# Patient Record
Sex: Female | Born: 1980 | Race: Black or African American | Hispanic: No | Marital: Married | Smoking: Current every day smoker
Health system: Southern US, Community
[De-identification: ages and names within clinical notes are randomized; demographics above are authoritative.]

## PROBLEM LIST (undated history)

## (undated) DIAGNOSIS — K219 Gastro-esophageal reflux disease without esophagitis: Secondary | ICD-10-CM

## (undated) DIAGNOSIS — R569 Unspecified convulsions: Secondary | ICD-10-CM

## (undated) DIAGNOSIS — T7840XA Allergy, unspecified, initial encounter: Secondary | ICD-10-CM

## (undated) DIAGNOSIS — Q8501 Neurofibromatosis, type 1: Secondary | ICD-10-CM

## (undated) DIAGNOSIS — S92919A Unspecified fracture of unspecified toe(s), initial encounter for closed fracture: Secondary | ICD-10-CM

## (undated) DIAGNOSIS — D649 Anemia, unspecified: Secondary | ICD-10-CM

## (undated) HISTORY — DX: Allergy, unspecified, initial encounter: T78.40XA

## (undated) HISTORY — PX: FRACTURE SURGERY: SHX138

## (undated) HISTORY — DX: Unspecified convulsions: R56.9

## (undated) HISTORY — PX: TUBAL LIGATION: SHX77

## (undated) HISTORY — PX: BACK SURGERY: SHX140

## (undated) HISTORY — PX: BREAST SURGERY: SHX581

## (undated) HISTORY — DX: Gastro-esophageal reflux disease without esophagitis: K21.9

## (undated) HISTORY — PX: EXPLORATORY LAPAROTOMY: SUR591

## (undated) HISTORY — PX: OTHER SURGICAL HISTORY: SHX169

## (undated) HISTORY — PX: HAND SURGERY: SHX662

## (undated) HISTORY — DX: Anemia, unspecified: D64.9

## (undated) HISTORY — PX: CHOLECYSTECTOMY: SHX55

## (undated) HISTORY — DX: Neurofibromatosis, type 1: Q85.01

## (undated) HISTORY — DX: Unspecified fracture of unspecified toe(s), initial encounter for closed fracture: S92.919A

---
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
## 2004-11-11 ENCOUNTER — Observation Stay: Payer: Self-pay | Admitting: Gynecology

## 2004-11-30 ENCOUNTER — Observation Stay: Payer: Self-pay | Admitting: Obstetrics & Gynecology

## 2005-01-15 ENCOUNTER — Observation Stay: Payer: Self-pay

## 2005-02-01 ENCOUNTER — Observation Stay: Payer: Self-pay

## 2005-05-29 ENCOUNTER — Emergency Department: Payer: Self-pay | Admitting: Emergency Medicine

## 2005-07-28 ENCOUNTER — Emergency Department: Payer: Self-pay | Admitting: Emergency Medicine

## 2005-08-19 ENCOUNTER — Emergency Department: Payer: Self-pay | Admitting: Emergency Medicine

## 2005-12-31 ENCOUNTER — Emergency Department: Payer: Self-pay | Admitting: Emergency Medicine

## 2005-12-31 ENCOUNTER — Other Ambulatory Visit: Payer: Self-pay

## 2006-03-08 ENCOUNTER — Other Ambulatory Visit: Payer: Self-pay

## 2006-03-08 ENCOUNTER — Emergency Department: Payer: Self-pay | Admitting: Emergency Medicine

## 2008-07-24 ENCOUNTER — Emergency Department: Payer: Self-pay | Admitting: Emergency Medicine

## 2008-07-24 ENCOUNTER — Other Ambulatory Visit: Payer: Self-pay

## 2014-10-18 ENCOUNTER — Emergency Department: Payer: Self-pay | Admitting: Internal Medicine

## 2014-10-18 LAB — COMPREHENSIVE METABOLIC PANEL
Albumin: 3.8 g/dL (ref 3.4–5.0)
Alkaline Phosphatase: 63 U/L
Anion Gap: 7 (ref 7–16)
BUN: 17 mg/dL (ref 7–18)
Bilirubin,Total: 0.2 mg/dL (ref 0.2–1.0)
Calcium, Total: 8.7 mg/dL (ref 8.5–10.1)
Chloride: 109 mmol/L — ABNORMAL HIGH (ref 98–107)
Co2: 26 mmol/L (ref 21–32)
Creatinine: 0.77 mg/dL (ref 0.60–1.30)
EGFR (African American): >60
EGFR (Non-African Amer.): >60
Glucose: 93 mg/dL (ref 65–99)
Osmolality: 284 (ref 275–301)
Potassium: 3.7 mmol/L (ref 3.5–5.1)
SGOT(AST): 17 U/L (ref 15–37)
SGPT (ALT): 22 U/L
Sodium: 142 mmol/L (ref 136–145)
Total Protein: 7 g/dL (ref 6.4–8.2)

## 2014-10-18 LAB — CBC
HCT: 38.4 % (ref 35.0–47.0)
HGB: 11.6 g/dL — ABNORMAL LOW (ref 12.0–16.0)
MCH: 21.1 pg — ABNORMAL LOW (ref 26.0–34.0)
MCHC: 30.3 g/dL — ABNORMAL LOW (ref 32.0–36.0)
MCV: 70 fL — ABNORMAL LOW (ref 80–100)
Platelet: 183 10*3/uL (ref 150–440)
RBC: 5.52 10*6/uL — ABNORMAL HIGH (ref 3.80–5.20)
RDW: 14.2 % (ref 11.5–14.5)
WBC: 5.6 10*3/uL (ref 3.6–11.0)

## 2014-10-18 LAB — URINALYSIS, COMPLETE
Bacteria: NONE SEEN
RBC,UR: 3864 /HPF (ref 0–5)
Specific Gravity: 1.023 (ref 1.003–1.030)
Squamous Epithelial: NONE SEEN
WBC UR: 75 /HPF (ref 0–5)

## 2014-10-18 LAB — DRUG SCREEN, URINE

## 2014-10-18 LAB — ETHANOL: Ethanol: <3 mg/dL

## 2015-01-30 ENCOUNTER — Inpatient Hospital Stay
Admit: 2015-01-30 | Disposition: A | Discharge status: Home / Self Care | Payer: Self-pay | Attending: Internal Medicine | Admitting: Internal Medicine

## 2015-01-30 LAB — ETHANOL

## 2015-01-30 LAB — CBC
HCT: 37.3 % (ref 35.0–47.0)
HGB: 11.6 g/dL — ABNORMAL LOW (ref 12.0–16.0)
MCH: 21.1 pg — ABNORMAL LOW (ref 26.0–34.0)
MCHC: 31.1 g/dL — ABNORMAL LOW (ref 32.0–36.0)
MCV: 68 fL — AB (ref 80–100)
PLATELETS: 186 10*3/uL (ref 150–440)
RBC: 5.49 10*6/uL — AB (ref 3.80–5.20)
RDW: 14.4 % (ref 11.5–14.5)
WBC: 6.3 10*3/uL (ref 3.6–11.0)

## 2015-01-30 LAB — COMPREHENSIVE METABOLIC PANEL
ALT: 14 U/L
Albumin: 4 g/dL
Alkaline Phosphatase: 58 U/L
Anion Gap: 3 — ABNORMAL LOW (ref 7–16)
BUN: 7 mg/dL
Bilirubin,Total: 0.3 mg/dL
CO2: 27 mmol/L
Calcium, Total: 8.7 mg/dL — ABNORMAL LOW
Chloride: 108 mmol/L
Creatinine: 0.67 mg/dL
EGFR (Non-African Amer.): >60
Glucose: 99 mg/dL
Potassium: 3.8 mmol/L
SGOT(AST): 15 U/L
SODIUM: 138 mmol/L
Total Protein: 6.9 g/dL

## 2015-01-30 LAB — DRUG SCREEN, URINE
AMPHETAMINES, UR SCREEN: NEGATIVE
Barbiturates, Ur Screen: NEGATIVE
Benzodiazepine, Ur Scrn: POSITIVE
Cannabinoid 50 Ng, Ur ~~LOC~~: NEGATIVE
Cocaine Metabolite,Ur ~~LOC~~: NEGATIVE
MDMA (ECSTASY) UR SCREEN: NEGATIVE
Methadone, Ur Screen: NEGATIVE
OPIATE, UR SCREEN: NEGATIVE
PHENCYCLIDINE (PCP) UR S: NEGATIVE
TRICYCLIC, UR SCREEN: POSITIVE

## 2015-01-30 LAB — URINALYSIS, COMPLETE
BACTERIA: NONE SEEN
BLOOD: NEGATIVE
Bilirubin,UR: NEGATIVE
GLUCOSE, UR: NEGATIVE mg/dL (ref 0–75)
Ketone: NEGATIVE
Leukocyte Esterase: NEGATIVE
NITRITE: NEGATIVE
Ph: 6 (ref 4.5–8.0)
Protein: NEGATIVE
RBC,UR: NONE SEEN /HPF (ref 0–5)
SPECIFIC GRAVITY: 1.009 (ref 1.003–1.030)

## 2015-01-30 LAB — VALPROIC ACID LEVEL: Valproic Acid: <10 ug/mL (ref 50–100)

## 2015-01-31 LAB — IRON AND TIBC
Iron Bind.Cap.(Total): 340 (ref 250–450)
Iron Saturation: 20
Iron: 68 ug/dL
Unbound Iron-Bind.Cap.: 272.3

## 2015-01-31 LAB — BASIC METABOLIC PANEL
Anion Gap: 5 — ABNORMAL LOW (ref 7–16)
BUN: 7 mg/dL
Calcium, Total: 8.4 mg/dL — ABNORMAL LOW
Chloride: 108 mmol/L
Co2: 26 mmol/L
Creatinine: 0.5 mg/dL
EGFR (African American): >60
EGFR (Non-African Amer.): >60
Glucose: 74 mg/dL
Potassium: 3.9 mmol/L
Sodium: 139 mmol/L

## 2015-01-31 LAB — CBC WITH DIFFERENTIAL/PLATELET
Basophil #: 0 10*3/uL (ref 0.0–0.1)
Basophil %: 0.9 %
Eosinophil #: 0.3 10*3/uL (ref 0.0–0.7)
Eosinophil %: 7.4 %
HCT: 34.4 % — ABNORMAL LOW (ref 35.0–47.0)
HGB: 10.7 g/dL — ABNORMAL LOW (ref 12.0–16.0)
Lymphocyte #: 2.3 10*3/uL (ref 1.0–3.6)
Lymphocyte %: 47.6 %
MCH: 21.2 pg — ABNORMAL LOW (ref 26.0–34.0)
MCHC: 31.1 g/dL — ABNORMAL LOW (ref 32.0–36.0)
MCV: 68 fL — ABNORMAL LOW (ref 80–100)
Monocyte #: 0.3 x10 3/mm (ref 0.2–0.9)
Monocyte %: 6 %
Neutrophil #: 1.8 10*3/uL (ref 1.4–6.5)
Neutrophil %: 38.1 %
Platelet: 152 10*3/uL (ref 150–440)
RBC: 5.05 10*6/uL (ref 3.80–5.20)
RDW: 14.4 % (ref 11.5–14.5)
WBC: 4.7 10*3/uL (ref 3.6–11.0)

## 2015-01-31 LAB — MAGNESIUM: Magnesium: 1.8 mg/dL

## 2015-01-31 LAB — FERRITIN: Ferritin (ARMC): 7 ng/mL — ABNORMAL LOW

## 2015-01-31 LAB — VALPROIC ACID LEVEL: Valproic Acid: 43 ug/mL — ABNORMAL LOW (ref 50–100)

## 2015-02-01 LAB — URINALYSIS, COMPLETE
BLOOD: NEGATIVE
Bilirubin,UR: NEGATIVE
Glucose,UR: NEGATIVE mg/dL (ref 0–75)
KETONE: NEGATIVE
Nitrite: NEGATIVE
PROTEIN: NEGATIVE
Ph: 7 (ref 4.5–8.0)
SPECIFIC GRAVITY: 1.005 (ref 1.003–1.030)

## 2015-02-14 NOTE — Discharge Summary (Signed)
PATIENT NAME:  Valerie Mckinney, Valerie Mckinney MR#:  [id number] DATE OF BIRTH:  06/07/1981  DATE OF ADMISSION:  01/30/2015 DATE OF DISCHARGE:  02/01/2015  ADMITTING DIAGNOSIS:  Status epilepticus.   DISCHARGE DIAGNOSES:  1. Epilepsy with recurrent seizures with postictal confusion, resolved.  2. Cardiac murmur of unclear etiology at this time.  3. Insomnia, on trazodone.  4. Iron deficiency anemia, unknown etiology.  5. Generalized weakness.  6. Lower back pain.  7. Dysuria.  No urinary tract infection.  8. History of epilepsy, neurofibromatosis, right ankle surgery, exploratory laparoscopy in the remote past.   DISCHARGE CONDITION:  Stable.   DISCHARGE MEDICATIONS:  The patient is to continue Keppra at higher doses at 1 gram twice daily, zolpidem 5 mg at bedtime as needed, iron sulfate 325 mg 3 times daily with meals, docusate sodium 100 mg twice daily, melatonin 6 mg p.o. at bedtime.   HOME OXYGEN:  None.   DIET:  Regular consistency.   ACTIVITY LIMITATIONS:  As tolerated.   REFERRAL:  To home health physical therapy.    FOLLOWUP APPOINTMENTS:  With primary physician in two days after discharge, [facility] neurology in one month after discharge, and Dr. Dossie Arbour in two days after discharge.    CONSULTANTS:  Care management; social work; Dr. Valora Corporal, neurologist; also physical therapy.   RADIOLOGIC STUDIES:  None.   HOSPITAL COURSE:  The patient is a 34 year old African American female with past medical history significant for history of epilepsy who presents to the hospital with complaints of seizure. Please refer to Dr. Lianne Moris admission note on 01/30/2015.  Apparently, the patient had a seizure at home, and then on arrival to the Emergency Room, she had two additional episodes of seizure.  Post seizures, she was confused and was admitted to the hospital for further evaluation.  Initial vitals:  Temperature was 98.4, pulse was 80, respirations were 18-20, blood pressure was 115/79, and saturation  was 100% on room air.  Physical exam was unremarkable except for lethargy as well as confusion.  The patient's lab data done on arrival to the hospital showed normal BMP, alcohol level less than 5.  Liver enzymes were normal.  The patient's valproic acid level was less than 10.  Urine drug screen was positive for benzodiazepines as well as tricyclic antidepressants. On the patient's CBC, white blood cell count was 6.3, hemoglobin was 11.6, and platelet count was 186,000, with low MCV of 68.  Urinalysis was unremarkable.  The patient was admitted to the hospital for further evaluation, Keppra was advanced, and neurology consultation was obtained.  Dr. Valora Corporal saw the patient in consultation on 01/31/2015.  He felt that patient had epilepsy with breakthrough seizures, epilepsy which was reasonably controlled and present since childhood.  He recommended to continue high doses of Keppra at 1 gram twice daily and follow up with her primary care physician.  In regard to insomnia which was felt to be moderate and could provoke further seizures significant enough to need to be treated, he recommended to start the patient on melatonin at 6 mg at bedtime as well as Ambien as needed.  She was recommended not to drive or operate heavy machinery for the next six months and follow up with [facility] in three months after discharge.  She was also advised to sleep at least 7 or 8 hours a night.  The patient did well with current medications.  Her postictal confusion resolved as time progressed.  She was noted to have significant weakness  in the lower extremities, but she told the rounding physician that she always has this kind of symptoms, including hoarseness in her throat, postictally.  Since she was having some lower back pains and she has been having low back pains for a while, she was felt to need evaluation by Dr. Dossie Arbour and possibly some radiologic studies as outpatient.  Meanwhile, she was recommended to be  discharged home with home health physical therapy by physical therapist seeing her in the hospital.  On the day of discharge, she felt satisfactory, did not complain of any significant discomfort.  She is being discharged with the above medications and followup.  Her vital signs:  Temperature was 98.7, pulse was 91, respirations were 16, blood pressure was 113/74, and saturation was 99% on room air at rest.  Of note, the patient was noted to be anemic in the hospital with very low MCV. The patient's iron studies revealed significant iron deficiency anemia with ferritin level of 7.  She was initiated on iron supplements and guaiac of stool was ordered, however, not obtained during her stay in the hospital time.  It is recommended to get guaiac of stool as outpatient and further investigate her iron deficiency anemia.  Meanwhile, she was prescribed iron supplementation along with docusate sodium.  The patient is being discharged in stable condition with the above-mentioned medications and followup.   TIME SPENT:  Forty minutes    ____________________________ Theodoro Grist, MD rv:kc D: 02/01/2015 15:01:03 ET T: 02/01/2015 18:55:43 ET JOB#: [id number]  cc: Theodoro Grist, MD, <Dictator> Primary Care Physician [facility] [place]. Dossie Arbour, MD  Theodoro Grist MD ELECTRONICALLY SIGNED 02/02/2015 19:32

## 2015-02-14 NOTE — H&P (Signed)
PATIENT NAME:  Valerie, Mckinney MR#:  [id number] DATE OF BIRTH:  1981/02/10  DATE OF ADMISSION:  01/30/2015  PRIMARY CARE PHYSICIAN:   None local.    REFEREING PHYSICIAN: Dr.  Lavonia Drafts, MD  CHIEF COMPLAINT:   Seizure today.     HISTORY OF PRESENT ILLNESS: A 34 year old [place] American female with a history of seizure disorder, was sent to ED due to seizure episode today. The patient is alert, awake, but confused. According to Dr. Corky Downs, the patient was found to have a seizure at home this morning.   EMS gave her 2 Versed and then the patient was sent to ED for further evaluation.  The patient had 2 additional episodes of seizure.   Dr. Corky Downs gave Depakote IV in ED and also give 1 dose of Ativan. The patient's seizure stopped, but the patient is still confused.  Dr. Corky Downs requests admission for seizure.   PAST MEDICAL HISTORY: Seizure disorder, neurofibromatosis, tumor of right lower back.   PAST SURGICAL HISTORY: Tumor removal from the right lower back, cholecystectomy, right ankle surgery, and right breast surgery.   SOCIAL HISTORY:  Smokes 4 cigarettes a day for 17 years. Denies any alcohol drinking or illicit drugs.   FAMILY HISTORY: Mother has an enlarged heart and heart attack recently.   ALLERGIES: TO BENADRYL, FLAGYL, TYLENOL, ZOFRAN, TAPE.   HOME MEDICATION:  According to patient, the patient is taking Keppra 750 mg p.o. b.i.d.   REVIEW OF SYSTEMS:  CONSTITUTIONAL: The patient denies any fever or chills but has headache, dizziness,  weakness, and generalized body ache.  EYES: No double vision or blurred vision.  EARS, NOSE, AND THROAT: No postnasal drip, slurred speech, or dysphagia.   CARDIOVASCULAR: No chest pain, palpitation, orthopnea, or nocturnal dyspnea. No leg edema.  PULMONARY: No cough, sputum, shortness of breath, or hematemesis.  GASTROINTESTINAL: No abdominal pain, nausea, vomiting, or diarrhea. No melena or bloody stool.  GENITOURINARY:   No dysuria,   hematuria, or incontinence.  ENDOCRINE: No polyuria, polydipsia, heat or cold intolerance.  HEMATOLOGY: No easy bruising or bleeding.  NEUROLOGIC: Positive for seizure, loss of consciousness,   PHYSICAL EXAMINATION: VITAL SIGNS: Temperature 98.4, blood pressure 115/79, pulse 80, oxygen saturation 100% on room air.  GENERAL: The patient is alert, weak, AAO x3, but looks confused and lethargic, in no acute distress.  HEENT: Pupils round, equal, and react to light and accommodation.  NECK: Supple. No JVD or carotid bruit. No lymphadenopathy. No thyromegaly.  CARDIOVASCULAR: S1 and S2, regular rate and rhythm. No murmurs or gallops.  PULMONARY: Bilateral air entry. No wheezing or rales. No use of accessory muscles to breathe.  ABDOMEN: Soft. No distention or tenderness. No organomegaly. Bowel sounds present.  EXTREMITIES: No edema, clubbing or cyanosis. No calf tenderness.  Bilateral pedal pulses preset.  SKIN: No rash or jaundice.  NEUROLOGY: AAO x3 and looks lethargic and confused.   Follows commands. No focal deficit. Power 5 out of 5. Sensation intact.   LABORATORY DATA: Glucose 99, BUN 7, creatinine 0.67. Electrolytes are normal. Urinalysis showed benzodiazepine positive, tricyclic antidepressant positive.   WBCs 6.3, hemoglobin 11.6, platelets 186,000.  Urinalysis is negative.   IMPRESSIONS:  Status epilepticus. Tobacco abuse.  PLAN OF TREATMENT:  The patient will be admitted to medical floor and will start aspiration,  fall and seizure precautions, and neurologic checks.   Continue Keppra IV b.i.d.  and ativan prn. Follow-up neurology consult and also followup valproic acid.   Smoking cessation was counselled for  3 mintues.   Discussed the patient's condition and plan of treatment with the patient.   TIME SPENT: About 52 minutes.    ____________________________ Demetrios Loll, MD qc:tr D: 01/30/2015 14:53:59 ET T: 01/30/2015 16:37:16 ET JOB#: [id number]  cc: Demetrios Loll, MD,  <Dictator> Demetrios Loll MD ELECTRONICALLY SIGNED 01/30/2015 19:12

## 2015-02-14 NOTE — Consult Note (Signed)
Past Medical/Surgical Hx:  neurofibromitosis:   tumor to right lower back:   Ankle Surgery - Right:   exploratory lap:   Home Medications: Medication Instructions Last Modified Date/Time  levETIRAcetam 500 mg oral tablet 3 tabs (1577m) orally 2 times a day. 17-Apr-16 08:41   Allergies:  Tape: Rash  Benadryl: Seizures  Tylenol: Seizures  Flagyl: Hives  Zofran ODT: Hives  Vital Signs: **Vital Signs.:   17-Apr-16 16:52  Vital Signs Type Q 8hr  Temperature Temperature (F) 98.9  Celsius 37.1  Temperature Source oral  Pulse Pulse 72  Respirations Respirations 18  Systolic BP Systolic BP 1481 Diastolic BP (mmHg) Diastolic BP (mmHg) 82  Mean BP 97  Pulse Ox % Pulse Ox % 98  Pulse Ox Activity Level  At rest  Oxygen Delivery Room Air/ 21 %   Lab Results: Hepatic:  16-Apr-16 11:10   Bilirubin, Total 0.3 (0.3-1.2 NOTE: New Reference Range  12/22/14)  Alkaline Phosphatase 58 (38-126 NOTE: New Reference Range  12/22/14)  SGPT (ALT) 14 (14-54 NOTE: New Reference Range  12/22/14)  SGOT (AST) 15 (15-41 NOTE: New Reference Range  12/22/14)  Total Protein, Serum 6.9 (6.5-8.1 NOTE: New Reference Range  12/22/14)  Albumin, Serum 4.0 (3.5-5.0 NOTE: New reference range  12/22/14)  TDMs:  17-Apr-16 04:11   Valproic Acid, Serum  43 (Result(s) reported on 31 Jan 2015 at [facility])  Routine Chem:  16-Apr-16 11:10   Ethanol, S. <5 (0-0 NOTE: New Reference Range:  12/22/14)  17-Apr-16 04:11   Iron Binding Capacity (TIBC) 340  Unbound Iron Binding Capacity 272.3  Iron, Serum 68 (28-170 NOTE: New Reference Range:  12/22/14)  Iron Saturation 20.0 (Result(s) reported on 31 Jan 2015 at 09:33AM.)  Ferritin ([facility]  7 (11-307 NOTE: New Reference Range  12/22/14)  Glucose, Serum 74 (65-99 NOTE: New Reference Range  12/22/14)  BUN 7 (6-20 NOTE: New Reference Range  12/22/14)  Creatinine (comp) 0.50 (0.44-1.00 NOTE: New Reference Range  12/22/14)  Sodium, Serum 139  (135-145 NOTE: New Reference Range  12/22/14)  Potassium, Serum 3.9 (3.5-5.1 NOTE: New Reference Range  12/22/14)  Chloride, Serum 108 (101-111 NOTE: New Reference Range  12/22/14)  CO2, Serum 26 (22-32 NOTE: New Reference Range  12/22/14)  Calcium (Total), Serum  8.4 (8.9-10.3 NOTE: New Reference Range  12/22/14)  Anion Gap  5  eGFR (African American) >60  eGFR (Non-African American) >60 (eGFR values <621mmin/1.73 m2 may be an indication of chronic kidney disease (CKD). Calculated eGFR is useful in patients with stable renal function. The eGFR calculation will not be reliable in acutely ill patients when serum creatinine is changing rapidly. It is not useful in patients on dialysis. The eGFR calculation may not be applicable to patients at the low and high extremes of body sizes, pregnant women, and vegetarians.)  Magnesium, Serum 1.8 (1.7-2.4 THERAPEUTIC RANGE: 4-7 mg/dL TOXIC: > 10 mg/dL  ----------------------- NOTE: New Reference Range  12/22/14)  Urine Drugs:  [id number] Tricyclic Antidepressant, Ur Qual (comp) POSITIVE (Result(s) reported on 30 Jan 2015 at 12:22PM.)  Amphetamines, Urine Qual. NEGATIVE  MDMA, Urine Qual. NEGATIVE  Cocaine Metabolite, Urine Qual. NEGATIVE  Opiate, Urine qual NEGATIVE  Phencyclidine, Urine Qual. NEGATIVE  Cannabinoid, Urine Qual. NEGATIVE  Barbiturates, Urine Qual. NEGATIVE  Benzodiazepine, Urine Qual. POSITIVE (------------------ The URINE DRUG SCREEN provides only a preliminary, unconfirmed analytical test result and should not be used for non-medical purposes.  Clinical consideration and professional judgment should be applied to any positive drug  screen result due to possible interfering substances.  A more specific alternate chemical method must be used in order to obtain a confirmed analytical result.  Gas chromatography/mass spectrometry (GC/MS) is the preferred confirmatory method.)  Methadone, Urine Qual.  NEGATIVE  Routine UA:  16-Apr-16 11:10   Color (UA) Yellow  Clarity (UA) Clear  Glucose (UA) Negative  Bilirubin (UA) Negative  Ketones (UA) Negative  Specific Gravity (UA) 1.009  Blood (UA) Negative  pH (UA) 6.0  Protein (UA) Negative  Nitrite (UA) Negative  Leukocyte Esterase (UA) Negative (Result(s) reported on 30 Jan 2015 at 11:42AM.)  RBC (UA) NONE SEEN  WBC (UA) 0-5  Bacteria (UA) NONE SEEN  Epithelial Cells (UA) 0-5  Mucous (UA) PRESENT (Result(s) reported on 30 Jan 2015 at 11:42AM.)  Routine Hem:  17-Apr-16 04:11   WBC (CBC) 4.7  RBC (CBC) 5.05  Hemoglobin (CBC)  10.7  Hematocrit (CBC)  34.4  Platelet Count (CBC) 152  MCV  68  MCH  21.2  MCHC  31.1  RDW 14.4  Neutrophil % 38.1  Lymphocyte % 47.6  Monocyte % 6.0  Eosinophil % 7.4  Basophil % 0.9  Neutrophil # 1.8  Lymphocyte # 2.3  Monocyte # 0.3  Eosinophil # 0.3  Basophil # 0.0 (Result(s) reported on 31 Jan 2015 at 05:18AM.)   Impression/Recommendations: Recommendations:   Impression:Epilepsy-  mostly controlled and presents since childhooldInsomnia-  moderate, could provoke further seizures increase Keppra to 1gm BID start melatonin 29m qHS add low dose Ambien [id number] pt needs to sleep 7-8 hours nightly no driving or operating heavy machinery x 6 months can go [facility] in morning [facility]  [facility]  Electronic Signatures: SmJamison NeighborMD)  (Signed 17-Apr-16 20:28)  Authored: PAST MEDICAL/SURGICAL HISTORY, HOME MEDICATIONS, ALLERGIES, NURSING VITAL SIGNS, LAB RESULTS, Recommendations   Last Updated: 17-Apr-16 20:28 by SmJamison NeighborMD)

## 2015-09-16 ENCOUNTER — Encounter: Payer: Self-pay | Admitting: Pain Medicine

## 2015-09-16 ENCOUNTER — Ambulatory Visit: Payer: Medicare Other | Attending: Pain Medicine | Admitting: Pain Medicine

## 2015-09-16 VITALS — BP 116/84 | HR 90 | Temp 98.2°F | Resp 14 | Ht 65.0 in | Wt 170.0 lb

## 2015-09-16 DIAGNOSIS — G43909 Migraine, unspecified, not intractable, without status migrainosus: Secondary | ICD-10-CM | POA: Insufficient documentation

## 2015-09-16 DIAGNOSIS — G4700 Insomnia, unspecified: Secondary | ICD-10-CM | POA: Insufficient documentation

## 2015-09-16 DIAGNOSIS — G40909 Epilepsy, unspecified, not intractable, without status epilepticus: Secondary | ICD-10-CM | POA: Insufficient documentation

## 2015-09-16 DIAGNOSIS — K219 Gastro-esophageal reflux disease without esophagitis: Secondary | ICD-10-CM | POA: Diagnosis not present

## 2015-09-16 DIAGNOSIS — G43109 Migraine with aura, not intractable, without status migrainosus: Secondary | ICD-10-CM

## 2015-09-16 DIAGNOSIS — Q8500 Neurofibromatosis, unspecified: Secondary | ICD-10-CM

## 2015-09-16 DIAGNOSIS — M160 Bilateral primary osteoarthritis of hip: Secondary | ICD-10-CM

## 2015-09-16 DIAGNOSIS — M5481 Occipital neuralgia: Secondary | ICD-10-CM | POA: Insufficient documentation

## 2015-09-16 DIAGNOSIS — M533 Sacrococcygeal disorders, not elsewhere classified: Secondary | ICD-10-CM

## 2015-09-16 NOTE — Progress Notes (Signed)
Subjective:    Patient ID: Valerie Mckinney, female    DOB: [birth year], 34 y.o.   MRN: [id number]  HPI The patient is a 34 year old female who comes to pain management center at the request of Dr. Sabino Snipes for further evaluation and treatment of pain. The patient is with history of neurofibromatosis. 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 Patient states that she has had abnormalities occurring in the region of the hip as well the patient states that her pain is becoming more increasingly severe. The patient described her pain as aching agonizing annoying constant cramping "deep disabling distressing. We'll build exhausting sensations patient stated the pain was getting along with heavy like horrible sensation sometimes hot sensation nagging and pressure-like sensations occurring. Patient described her pain as sharp shooting splitting stepping tenderness throbbing tingling sensation that it appeared the patient ability to go to sleep and awakens patient from sleep. Patient stated the pain was associated with spasms and increased with bending climbing intercourse kneeling lifting motion sitting standing squatting stooping twisting walking. Patient stated the pain was made worse with surgery. Patient stated the pain has decreased with warm showers and baths as well as hot packs and medications. We discussed patient's overall condition and patient is with history of seizure disorder with seizure occurring this year. We informed patient that we would prefer to avoid prescribing medications for patient due to her seizure disorder. We will have patient follow up with her neurologist Dr. Melrose Nakayama for further assessment of her condition at this time. We also informed patient that we will have patient undergo evaluation at [place] [facility] to to and would have [facility] to recommend treatment regimen for patient. We informed patient that  we may treat patient once we receive recommendations from [facility]. The patient was understanding and agreed to suggested treatment plan and will proceed with evaluation at [facility] once her appointment becomes available. At the present time patient will follow-up with Dr. Melrose Nakayama and we will remain available to proceed with further treatment of pain of patient pending recommendations of [place] [facility]. At the present time patient is taking Keppra and other medications. Patient stated that she had received significant benefit from Neurontin in the past. We have informed patient to discussed the use of Neurontin with Dr. Melrose Nakayama at time of return appointment with Dr. Melrose Nakayama. The patient was in agreement suggested treatment plan we will await [place] [facility] recommendations as as well as results of evaluation and treatment of Dr. Melrose Nakayama and consider patient for further treatment as discussed. The patient agreed to suggested treatment plan.     Review of Systems     Cardiovascular: Unremarkable   Pulmonary: Unremarkable  Neurological: Seizures  Psychological: Insomnia  Gastrointestinal: Unremarkable  Genitourinary: Gastroesophageal reflux disease  Hematologic:   anemia Endocrine: Unremarkable  Rheumatological: Unremarkable  Musculoskeletal: Unremarkable  Other significant:  neurofibromatosis     Objective:   Physical Exam  The patient appeared to be well-developed well nourished female in no acute distress  There was tenderness to palpation of paraspinal musculatures and cervical region cervical facet region. Palpation reproduced radiation of pain in the cervical region toward the occipitalis region. There was tends to palpation of the splenius capitis and occipitalis regions a moderate degree. No bounding pulsations of the temporal regions were noted. There was unremarkable Spurling's maneuver. Patient appeared to be with  bilaterally equal grip  strength. Tinel and Phalen's maneuver were without increased pain of significant degree. Palpation over the thoracic region thoracic facet region was without tends to palpation of severe degree. There was moderate tenderness to palpation over the thoracic paraspinal musculature region. No crepitus of the thoracic region was noted. Palpation over the lumbar paraspinal musculature region lumbar facet region was attends to palpation of moderate degree with lateral bending rotation extension and palpation of the lumbar facets reproducing moderate discomfort. There was moderate tenderness to palpation over the PSIS NP IIS regions. Straight leg raising was tolerated to 30 without a definite increased pain with dorsiflexion noted. DTRs were trace at the knees. No definite sensory deficit or dermatomal distribution detected. There was negative clonus negative Homans. There was minimal tenderness of the greater trochanteric region iliotibial band region. Abdomen was nontender with no costovertebral tenderness noted.      Assessment & Plan:   Neurofibromatosis  Seizure disorder  Migraine headaches  Greater occipital neuralgia     PLAN   Continue present medications as discussed and reviewed medications with Dr. Melrose Nakayama at time of return appointment. Patient is to inform Dr. Melrose Nakayama of her previous use of Neurontin and benefit with use of Neurontin in terms of control of pain at the present time patient will continue Keppra as prescribed for control of her seizures   F/U PCP Dr. Sabino Snipes for  evaliation of  BP and general medical  condition  F/U surgical evaluation. May consider pending follow-up evaluations  F/U neurological evaluation. . Patient will follow-up with Dr. Melrose Nakayama as planned. Patient advised to mention her previous use of Neurontin with Dr. Melrose Nakayama. Patient stated Neurontin had been of significant benefit in terms of reducing her pain due to  neurofibromatosis. Patient presently taking Keppra well-controlled of her seizures. Patient also with history of migraine headaches and is without use of Topamax in the past Appointment [place] for further evaluation and recommendations regarding treatment of patient's condition. Patient with history of neurofibromatosis and seizure disorder  May consider radiofrequency rhizolysis or intraspinal procedures pending response to present treatment and F/U evaluation   Patient to call Pain Management Center should patient have concerns prior to scheduled return appointment.

## 2015-09-16 NOTE — Patient Instructions (Addendum)
Continue present medications  F/U PCP Dr.Meridth Soles  for evaliation of  BP and general medical  condition  F/U surgical evaluation. May consider pending follow-up evaluations. Patient will undergo further surgical evaluation as planned  Ask receptionist date of your appointment at [facility]  F/U neurological evaluation. Follow-up Dr. Melrose Nakayama as discussed . Please mention Neurontin to Dr. Melrose Nakayama at time of your reevaluation  May consider radiofrequency rhizolysis or intraspinal procedures pending response to present treatment and F/U evaluation   Patient to call Pain Management Center should patient have concerns prior to scheduled return appointment.

## 2015-09-17 ENCOUNTER — Other Ambulatory Visit: Payer: Self-pay | Admitting: Pain Medicine

## 2015-09-21 ENCOUNTER — Other Ambulatory Visit: Payer: Self-pay | Admitting: Pain Medicine

## 2015-09-21 DIAGNOSIS — Q8500 Neurofibromatosis, unspecified: Secondary | ICD-10-CM

## 2015-10-01 ENCOUNTER — Other Ambulatory Visit: Payer: Self-pay | Admitting: Pain Medicine

## 2015-10-22 ENCOUNTER — Ambulatory Visit: Admission: RE | Admit: 2015-10-22 | Payer: Medicare Other | Source: Ambulatory Visit

## 2015-10-22 ENCOUNTER — Ambulatory Visit
Admission: RE | Admit: 2015-10-22 | Discharge: 2015-10-22 | Disposition: A | Discharge status: Home / Self Care | Payer: Medicare Other | Source: Ambulatory Visit | Attending: Pain Medicine | Admitting: Pain Medicine

## 2015-10-22 DIAGNOSIS — Q8500 Neurofibromatosis, unspecified: Secondary | ICD-10-CM | POA: Insufficient documentation

## 2015-10-22 DIAGNOSIS — M50222 Other cervical disc displacement at C5-C6 level: Secondary | ICD-10-CM | POA: Insufficient documentation

## 2015-10-22 DIAGNOSIS — M50221 Other cervical disc displacement at C4-C5 level: Secondary | ICD-10-CM | POA: Diagnosis not present

## 2015-10-25 ENCOUNTER — Telehealth: Payer: Self-pay | Admitting: *Deleted

## 2015-10-25 NOTE — Telephone Encounter (Signed)
Spoke with patient re; MRI result, transferred up to front desk to schedule appt for f/up.

## 2015-10-29 ENCOUNTER — Other Ambulatory Visit: Payer: Self-pay

## 2015-11-02 ENCOUNTER — Emergency Department
Admission: EM | Admit: 2015-11-02 | Discharge: 2015-11-02 | Disposition: A | Discharge status: Home / Self Care | Payer: Medicare Other | Attending: Emergency Medicine | Admitting: Emergency Medicine

## 2015-11-02 ENCOUNTER — Emergency Department: Payer: Medicare Other

## 2015-11-02 DIAGNOSIS — N938 Other specified abnormal uterine and vaginal bleeding: Secondary | ICD-10-CM

## 2015-11-02 DIAGNOSIS — R102 Pelvic and perineal pain: Secondary | ICD-10-CM

## 2015-11-02 DIAGNOSIS — Z3202 Encounter for pregnancy test, result negative: Secondary | ICD-10-CM | POA: Diagnosis not present

## 2015-11-02 DIAGNOSIS — Z79899 Other long term (current) drug therapy: Secondary | ICD-10-CM | POA: Diagnosis not present

## 2015-11-02 DIAGNOSIS — R1030 Lower abdominal pain, unspecified: Secondary | ICD-10-CM | POA: Diagnosis present

## 2015-11-02 DIAGNOSIS — F17210 Nicotine dependence, cigarettes, uncomplicated: Secondary | ICD-10-CM | POA: Insufficient documentation

## 2015-11-02 LAB — URINALYSIS COMPLETE WITH MICROSCOPIC (ARMC ONLY)
BACTERIA UA: NONE SEEN
SQUAMOUS EPITHELIAL / LPF: NONE SEEN
Specific Gravity, Urine: 1.025 (ref 1.005–1.030)

## 2015-11-02 LAB — COMPREHENSIVE METABOLIC PANEL
ALT: 13 U/L — AB (ref 14–54)
AST: 14 U/L — AB (ref 15–41)
Albumin: 4.3 g/dL (ref 3.5–5.0)
Alkaline Phosphatase: 52 U/L (ref 38–126)
Anion gap: 6 (ref 5–15)
BUN: 7 mg/dL (ref 6–20)
CHLORIDE: 105 mmol/L (ref 101–111)
CO2: 25 mmol/L (ref 22–32)
Calcium: 8.7 mg/dL — ABNORMAL LOW (ref 8.9–10.3)
Creatinine, Ser: 0.66 mg/dL (ref 0.44–1.00)
GFR calc Af Amer: >60 mL/min (ref >60)
GFR calc non Af Amer: >60 mL/min (ref >60)
GLUCOSE: 106 mg/dL — AB (ref 65–99)
Potassium: 3.9 mmol/L (ref 3.5–5.1)
SODIUM: 136 mmol/L (ref 135–145)
TOTAL PROTEIN: 7.4 g/dL (ref 6.5–8.1)
Total Bilirubin: 0.6 mg/dL (ref 0.3–1.2)

## 2015-11-02 LAB — CBC
HEMATOCRIT: 36.7 % (ref 35.0–47.0)
Hemoglobin: 11.5 g/dL — ABNORMAL LOW (ref 12.0–16.0)
MCH: 20.9 pg — ABNORMAL LOW (ref 26.0–34.0)
MCHC: 31.2 g/dL — AB (ref 32.0–36.0)
MCV: 66.9 fL — AB (ref 80.0–100.0)
Platelets: 214 10*3/uL (ref 150–440)
RBC: 5.48 MIL/uL — ABNORMAL HIGH (ref 3.80–5.20)
RDW: 15.7 % — AB (ref 11.5–14.5)
WBC: 6 10*3/uL (ref 3.6–11.0)

## 2015-11-02 LAB — CHLAMYDIA/NGC RT PCR (ARMC ONLY)
Chlamydia Tr: NOT DETECTED
N gonorrhoeae: NOT DETECTED

## 2015-11-02 LAB — LIPASE, BLOOD: LIPASE: 22 U/L (ref 11–51)

## 2015-11-02 LAB — WET PREP, GENITAL
CLUE CELLS WET PREP: NONE SEEN
Sperm: NONE SEEN
Trich, Wet Prep: NONE SEEN
YEAST WET PREP: NONE SEEN

## 2015-11-02 LAB — POCT PREGNANCY, URINE: PREG TEST UR: NEGATIVE

## 2015-11-02 MED ORDER — SODIUM CHLORIDE 0.9 % IV BOLUS (SEPSIS)
1000.0000 mL | Freq: Once | INTRAVENOUS | Status: AC
  Administered 2015-11-02: 1000 mL via INTRAVENOUS

## 2015-11-02 MED ORDER — OXYCODONE HCL 5 MG PO TABS: 5.0000 mg | ORAL_TABLET | Freq: Three times a day (TID) | ORAL | Status: DC | PRN

## 2015-11-02 MED ORDER — FENTANYL CITRATE (PF) 100 MCG/2ML IJ SOLN
50.0000 ug | Freq: Once | INTRAMUSCULAR | Status: AC
  Administered 2015-11-02: 50 ug via INTRAVENOUS
  Filled 2015-11-02: qty 2

## 2015-11-02 MED ORDER — KETOROLAC TROMETHAMINE 30 MG/ML IJ SOLN
30.0000 mg | Freq: Once | INTRAMUSCULAR | Status: AC
  Administered 2015-11-02: 30 mg via INTRAVENOUS
  Filled 2015-11-02: qty 1

## 2015-11-02 NOTE — ED Notes (Signed)
Pt reports pain to her RUQ for the past week. Pt reports pain worse today. Pt denies NVD, other sx's.

## 2015-11-02 NOTE — ED Notes (Signed)
Patient transported to Ultrasound 

## 2015-11-02 NOTE — ED Notes (Signed)
Pt also reports to RN her cycle started early yesterday and she is passing clots.
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
## 2015-11-16 ENCOUNTER — Ambulatory Visit: Payer: Medicare Other | Attending: Pain Medicine | Admitting: Pain Medicine

## 2015-11-16 ENCOUNTER — Encounter: Payer: Self-pay | Admitting: Pain Medicine

## 2015-11-16 VITALS — BP 123/70 | HR 83 | Temp 98.4°F | Resp 16 | Ht 65.0 in | Wt 170.0 lb

## 2015-11-16 DIAGNOSIS — R51 Headache: Secondary | ICD-10-CM | POA: Diagnosis present

## 2015-11-16 DIAGNOSIS — M546 Pain in thoracic spine: Secondary | ICD-10-CM | POA: Diagnosis present

## 2015-11-16 DIAGNOSIS — Q8500 Neurofibromatosis, unspecified: Secondary | ICD-10-CM | POA: Diagnosis not present

## 2015-11-16 DIAGNOSIS — M5481 Occipital neuralgia: Secondary | ICD-10-CM | POA: Insufficient documentation

## 2015-11-16 DIAGNOSIS — M533 Sacrococcygeal disorders, not elsewhere classified: Secondary | ICD-10-CM

## 2015-11-16 DIAGNOSIS — M170 Bilateral primary osteoarthritis of knee: Secondary | ICD-10-CM

## 2015-11-16 DIAGNOSIS — G43109 Migraine with aura, not intractable, without status migrainosus: Secondary | ICD-10-CM

## 2015-11-16 DIAGNOSIS — M160 Bilateral primary osteoarthritis of hip: Secondary | ICD-10-CM

## 2015-11-16 DIAGNOSIS — M50221 Other cervical disc displacement at C4-C5 level: Secondary | ICD-10-CM | POA: Diagnosis not present

## 2015-11-16 DIAGNOSIS — G43909 Migraine, unspecified, not intractable, without status migrainosus: Secondary | ICD-10-CM | POA: Diagnosis not present

## 2015-11-16 DIAGNOSIS — G40909 Epilepsy, unspecified, not intractable, without status epilepticus: Secondary | ICD-10-CM | POA: Diagnosis not present

## 2015-11-16 DIAGNOSIS — M47816 Spondylosis without myelopathy or radiculopathy, lumbar region: Secondary | ICD-10-CM | POA: Diagnosis not present

## 2015-11-16 DIAGNOSIS — M5030 Other cervical disc degeneration, unspecified cervical region: Secondary | ICD-10-CM | POA: Insufficient documentation

## 2015-11-16 DIAGNOSIS — M179 Osteoarthritis of knee, unspecified: Secondary | ICD-10-CM | POA: Insufficient documentation

## 2015-11-16 DIAGNOSIS — M1710 Unilateral primary osteoarthritis, unspecified knee: Secondary | ICD-10-CM | POA: Insufficient documentation

## 2015-11-16 DIAGNOSIS — M542 Cervicalgia: Secondary | ICD-10-CM | POA: Diagnosis present

## 2015-11-16 DIAGNOSIS — M50222 Other cervical disc displacement at C5-C6 level: Secondary | ICD-10-CM | POA: Insufficient documentation

## 2015-11-16 NOTE — Progress Notes (Signed)
   Subjective:    Patient ID: Valerie Mckinney, female    DOB: [birth year], 35 y.o.   MRN: [id number]  HPI    Review of Systems     Objective:   Physical Exam        Assessment & Plan:

## 2015-11-16 NOTE — Progress Notes (Signed)
Safety precautions to be maintained throughout the outpatient stay will include: orient to surroundings, keep bed in low position, maintain call bell within reach at all times, provide assistance with transfer out of bed and ambulation.  

## 2015-11-16 NOTE — Patient Instructions (Addendum)
Continue present medications  Sphenopalatine ganglion block to be performed at time return appointment  F/U PCP Dr.Meridth Soles  for evaliation of  BP and general medical  condition  F/U surgical evaluation. Ask receptionist the date of your neurosurgical evaluation  Ask receptionist date of your appointment at [facility]  Ask receptionist the date of MRI of your right knee  F/U neurological evaluation. Follow-up Dr. Melrose Nakayama as discussed . Please mention Neurontin to Dr. Melrose Nakayama at time of your reevaluation  May consider radiofrequency rhizolysis or intraspinal procedures pending response to present treatment and F/U evaluation . We  will avoid these procedures at this time  Patient to call [facility] Management Center should patient have concerns prior to scheduled return appointment.

## 2015-11-16 NOTE — Progress Notes (Signed)
Subjective:    Patient ID: Valerie Mckinney, female    DOB: Dec 13, 1980, 35 y.o.   MRN: [id number]  HPI  The patient is a 35 year old female who returns [facility] for further evaluation and treatment of pain involving the neck headaches entire back upper and lower right pain and lower extremity region. We discussed findings on patient's MRI of the spinal today's visit. Decision has been made to refer patient to neurosurgeon for further evaluation of patient's condition regarding intraspinal abnormalities. We discussed patient's headaches as well and we'll consider patient for sphenopalatine ganglion block to be performed at time return appointment in attempt to decrease severity of and frequency of patient's migraine headaches. There is also concern regarding component of headaches been due to greater occipital neuralgia and will consider patient for further evaluation and treatment in this regard as well. At the present time we will schedule patient for sphenopalatine ganglion block to be performed at time return appointment to see with scheduling patient for neurosurgical evaluation as discussed. We will also discuss having patient undergo evaluation at [facility] as well and pending recommendations of [place] we will consider patient for further modifications of treatment regimen. The patient will continue presently prescribed medications and will be scheduled for sphenopalatine Engman block to be performed at time return appointment in attempt to decrease severity of symptoms, minimize progression of symptoms, and avoid need for more involved treatment. The patient agreed to suggested treatment plan         Review of Systems     Objective:   Physical Exam There was tenderness of the splenius capitis and occipitalis musculature region of moderate to moderately severe degree. No bounding pulsations of the temporal region were noted. Palpation of  the cervical facet cervical paraspinal must reason was attends to palpation of moderate degree. The patient was with evidence of tenderness to palpation over the thoracic facet thoracic paraspinal must reason a moderate degree with no crepitus of the thoracic region noted. There appeared to be unremarkable Spurling's maneuver and patient appeared to be without increase of pain with Tinel and Phalen's maneuver and was with what appeared to be bilaterally equal grip strength. Palpation over the region of the thoracic facet thoracic paraspinal must reason with evidence of muscle spasms without crepitus of the thoracic region is noted. Palpation over the lumbar paraspinal must reason lumbar facet region associated with mild to moderate discomfort with lateral bending rotation extension and palpation over the lumbar facets reproducing mild to moderate discomfort. Straight leg raising was tolerates approximately 20 without increased pain with dorsiflexion noted. DTRs were difficult to elicit. There was no definite sensory deficit of dermatomal distribution detected. EHL strength appeared to be slightly decreased. There was negative clonus negative Homans. Palpation over the region of the ASIS and PII S region reproduced mild to moderate discomfort. With mild tenderness on the greater trochanteric region and iliotibial band region. 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 No lumbar disc protrusions, spinal or foraminal stenosis  Neurofibromatosis  Seizure disorder  Migraine headaches  Greater occipital neuralgia    PLAN  Continue  present medications  Sphenopalatine ganglion block to be performed at time return appointment  F/U PCP Dr.Meridth Soles  for evaliation of  BP and general medical  condition  F/U surgical evaluation. Ask receptionist the date of your neurosurgical evaluation  Ask receptionist date of your appointment at [facility]  Ask receptionist the date of MRI of your right knee  F/U neurological evaluation. Follow-up Dr. Melrose Nakayama as discussed . Please mention Neurontin to Dr. Melrose Nakayama at time of your reevaluation  May consider radiofrequency rhizolysis or intraspinal procedures pending response to present treatment and F/U evaluation . We  will avoid these procedures at this time  Patient to call Pain Management Center should patient have concerns prior to scheduled return appointment.

## 2015-11-22 ENCOUNTER — Encounter: Payer: Self-pay | Admitting: Pain Medicine

## 2015-11-22 ENCOUNTER — Ambulatory Visit: Payer: Medicare Other | Attending: Pain Medicine | Admitting: Pain Medicine

## 2015-11-22 VITALS — BP 125/78 | HR 71 | Temp 98.0°F | Resp 16 | Ht 65.0 in | Wt 175.0 lb

## 2015-11-22 DIAGNOSIS — G43109 Migraine with aura, not intractable, without status migrainosus: Secondary | ICD-10-CM

## 2015-11-22 DIAGNOSIS — M533 Sacrococcygeal disorders, not elsewhere classified: Secondary | ICD-10-CM

## 2015-11-22 DIAGNOSIS — R51 Headache: Secondary | ICD-10-CM | POA: Diagnosis present

## 2015-11-22 DIAGNOSIS — M160 Bilateral primary osteoarthritis of hip: Secondary | ICD-10-CM

## 2015-11-22 DIAGNOSIS — M170 Bilateral primary osteoarthritis of knee: Secondary | ICD-10-CM

## 2015-11-22 DIAGNOSIS — Q8500 Neurofibromatosis, unspecified: Secondary | ICD-10-CM

## 2015-11-22 DIAGNOSIS — M5481 Occipital neuralgia: Secondary | ICD-10-CM

## 2015-11-22 MED ORDER — LIDOCAINE HCL 4 % EX SOLN
CUTANEOUS | Status: AC
  Filled 2015-11-22: qty 50

## 2015-11-22 NOTE — Progress Notes (Signed)
   Subjective:    Patient ID: Valerie Mckinney, female    DOB: 1981-02-08, 35 y.o.   MRN: [id number]  HPI                                         SPHENOPALATINE  GANGLION BLOCK    The patient Is a  35 year old female who returns to pain management for further evaluation and treatment of headache felt to be due to migraine headache predominantly.. Decision has been made to proceed with sphenopalatine ganglion block to reduce severity an frequency of headaches    Description Of Procedure:  Sphenopalatine Ganglion Block   Patient assumed supine position with the head hyperextended..   EKG,blood pressure, pulse, and  Pulse oximetry monitoring were all in place.  1 cc of 4% lidocaine was instilled into the right nostril  followed by 1 cc of 4% lidocaine instilled into the left nostril. A cotton pledget soaked with 1 cc 4% lidocaine was then placed  into the right nostril followed by placement of a cotton pledget soaked with 1 cc of 4 % lidocaine placed into the left nostril. The remained in the position for 30 minutes  The patient tolerated the procedure well without complaint of headache at end of the procedure.    PLAN   Continue present medications  F/U PCP Dr.Meridth Soles  for evaliation of  BP and general medical  condition  F/U surgical evaluation. Ask receptionist the date of your neurosurgical evaluation  Ask receptionist date of your appointment at [facility]  Ask receptionist the date of MRI of your right knee  F/U neurological evaluation. Follow-up Dr. Melrose Nakayama as discussed . Please mention Neurontin to Dr. Melrose Nakayama at time of your reevaluation  May consider radiofrequency rhizolysis or intraspinal procedures pending response to present treatment and F/U evaluation . We  will avoid these procedures at this time  Patient to call Pain Management Center should patient have concerns prior to scheduled return appointment.   Review of Systems     Objective:   Physical Exam        Assessment & Plan:

## 2015-11-22 NOTE — Patient Instructions (Signed)
PLAN   Continue present medications  F/U PCP Dr.Meridth Soles  for evaliation of  BP and general medical  condition  F/U surgical evaluation. Ask receptionist the date of your neurosurgical evaluation  Ask receptionist date of your appointment at [facility]  Ask receptionist the date of MRI of your right knee  F/U neurological evaluation. Follow-up Dr. Melrose Nakayama as discussed . Please mention Neurontin to Dr. Melrose Nakayama at time of your reevaluation  May consider radiofrequency rhizolysis or intraspinal procedures pending response to present treatment and F/U evaluation . We  will avoid these procedures at this time  Patient to call Pain Management Center should patient have concerns prior to scheduled return appointment.

## 2015-11-22 NOTE — Progress Notes (Signed)
Safety precautions to be maintained throughout the outpatient stay will include: orient to surroundings, keep bed in low position, maintain call bell within reach at all times, provide assistance with transfer out of bed and ambulation.  

## 2015-11-23 ENCOUNTER — Telehealth: Payer: Self-pay | Admitting: *Deleted

## 2015-11-23 NOTE — Telephone Encounter (Signed)
Pt denies any problems

## 2015-12-08 ENCOUNTER — Ambulatory Visit
Admission: RE | Admit: 2015-12-08 | Discharge: 2015-12-08 | Disposition: A | Discharge status: Home / Self Care | Payer: Medicare Other | Source: Ambulatory Visit | Attending: Pain Medicine | Admitting: Pain Medicine

## 2015-12-08 DIAGNOSIS — M5481 Occipital neuralgia: Secondary | ICD-10-CM

## 2015-12-08 DIAGNOSIS — M545 Low back pain: Secondary | ICD-10-CM | POA: Diagnosis present

## 2015-12-08 DIAGNOSIS — M25561 Pain in right knee: Secondary | ICD-10-CM | POA: Diagnosis present

## 2015-12-08 DIAGNOSIS — G43109 Migraine with aura, not intractable, without status migrainosus: Secondary | ICD-10-CM

## 2015-12-08 DIAGNOSIS — M533 Sacrococcygeal disorders, not elsewhere classified: Secondary | ICD-10-CM

## 2015-12-08 DIAGNOSIS — Q8500 Neurofibromatosis, unspecified: Secondary | ICD-10-CM

## 2015-12-08 DIAGNOSIS — M160 Bilateral primary osteoarthritis of hip: Secondary | ICD-10-CM

## 2015-12-09 ENCOUNTER — Telehealth: Payer: Self-pay | Admitting: *Deleted

## 2015-12-09 NOTE — Telephone Encounter (Signed)
Spoke with patient, let her know there were no significant changes in MRI of knee and that Dr Primus Bravo would discuss this in further detail at her next appt. Patient verbalizes u/o this information.

## 2015-12-13 ENCOUNTER — Ambulatory Visit: Payer: Medicare Other | Attending: Pain Medicine | Admitting: Pain Medicine

## 2015-12-13 ENCOUNTER — Encounter: Payer: Self-pay | Admitting: Pain Medicine

## 2015-12-13 VITALS — BP 128/70 | HR 78 | Temp 98.8°F | Resp 18 | Ht 65.0 in | Wt 170.0 lb

## 2015-12-13 DIAGNOSIS — M47812 Spondylosis without myelopathy or radiculopathy, cervical region: Secondary | ICD-10-CM | POA: Diagnosis not present

## 2015-12-13 DIAGNOSIS — M546 Pain in thoracic spine: Secondary | ICD-10-CM | POA: Diagnosis present

## 2015-12-13 DIAGNOSIS — M170 Bilateral primary osteoarthritis of knee: Secondary | ICD-10-CM

## 2015-12-13 DIAGNOSIS — M50221 Other cervical disc displacement at C4-C5 level: Secondary | ICD-10-CM | POA: Diagnosis not present

## 2015-12-13 DIAGNOSIS — G40909 Epilepsy, unspecified, not intractable, without status epilepticus: Secondary | ICD-10-CM | POA: Insufficient documentation

## 2015-12-13 DIAGNOSIS — G43909 Migraine, unspecified, not intractable, without status migrainosus: Secondary | ICD-10-CM | POA: Insufficient documentation

## 2015-12-13 DIAGNOSIS — M47816 Spondylosis without myelopathy or radiculopathy, lumbar region: Secondary | ICD-10-CM | POA: Insufficient documentation

## 2015-12-13 DIAGNOSIS — Q8500 Neurofibromatosis, unspecified: Secondary | ICD-10-CM | POA: Diagnosis not present

## 2015-12-13 DIAGNOSIS — R51 Headache: Secondary | ICD-10-CM | POA: Insufficient documentation

## 2015-12-13 DIAGNOSIS — M5030 Other cervical disc degeneration, unspecified cervical region: Secondary | ICD-10-CM | POA: Diagnosis not present

## 2015-12-13 DIAGNOSIS — M160 Bilateral primary osteoarthritis of hip: Secondary | ICD-10-CM

## 2015-12-13 DIAGNOSIS — G43109 Migraine with aura, not intractable, without status migrainosus: Secondary | ICD-10-CM

## 2015-12-13 DIAGNOSIS — M5481 Occipital neuralgia: Secondary | ICD-10-CM | POA: Diagnosis not present

## 2015-12-13 DIAGNOSIS — M542 Cervicalgia: Secondary | ICD-10-CM | POA: Diagnosis present

## 2015-12-13 DIAGNOSIS — M533 Sacrococcygeal disorders, not elsewhere classified: Secondary | ICD-10-CM

## 2015-12-13 NOTE — Patient Instructions (Addendum)
PLAN   Continue present medications  Sphenopalatine ganglion block to be performed at time of return appointment  F/U PCP Dr.Meridth Soles  for evaliation of  BP and general medical  condition  F/U surgical evaluation. Ask receptionist the date of your neurosurgical evaluation  Ask receptionist date of your appointment at [facility] schedule appointment at [facility] pain clinic or [facility] pain clinic if more convenient for patient  F/U neurological evaluation. Follow-up Dr. Melrose Nakayama as discussed . Please mention Neurontin to Dr. Melrose Nakayama at time of your reevaluation  May consider radiofrequency rhizolysis or intraspinal procedures pending response to present treatment and F/U evaluation . We  will avoid these procedures at this time  Patient to call Pain Management Center should patient have concerns prior to scheduled return appointment.     Preparation for the injection: 1. Do not eat any solid food or dairy products within 6 hours of your appointment. 2. You may drink clear liquids up to 2 hours before appointment.  Clear liquids include water, black coffee, juice or soda.  No milk or cream please. 3. You may take your regular medication, including pain medications, with a sip of water before you appointment.  Diabetics should hold regular insulin (if taken separately) and take 1/2 normal NPH dose the morning of the procedure.  Carry some sugar containing items with you to your appointment. 4. A drive must accompany you and be prepared to drive you home after your procedure. 5. Bring all your current medications with you. 6. An IV may be inserted and sedation may be given at the discretion of the physician. 7. A blood pressure cuff, EKG and other monitors will often be applied during the procedure.  Some patients may need to have extra oxygen administered for a short period. 8. You will be asked to provide medical information, including your allergies, prior to the procedure.  We  must know immediately if you are taking blood thinners (like Coumadin) or if you are allergic to IV iodine contrast (dye).  Call if you experience:   Fever/chills  Extreme shortness of breath or inability to swallow  Redness, inflammation or drainage from the site  Weakness or numbness that last for greater than 24 hours  Please note:  If effective, we may have to do up to 3-5 blocks in a 3-4 week interval with physical therapy (depending on which condition you are being treated for).  After the procedure: You will be observed in the outpatient area and discharged home.  Please consult the discharge instructions given after the procedure for any post-procedure questions.  If you have any questions please call [phone] [facility]

## 2015-12-13 NOTE — Progress Notes (Signed)
Subjective:    Patient ID: Valerie Mckinney, female    DOB: 09/22/1981, 35 y.o.   MRN: [id number]  HPI The patient is a 35 year old female who returns to pain management for further evaluation and treatment of pain involving headaches as well as pain in the neck entire back upper and lower extremity regions. The patient is with pain involving the region of the right knee and MRI of the right knee was with unremarkable findings. We reviewed these studies of the patient's right knee MRI on today's visit. The patient stated that the sphenopalatine ganglion block decreased her headaches significantly. The patient wishes to proceed with sphenopalatine ganglion block again to decrease headaches more significantly. We will proceed with sphenopalatine ganglion block and we will have patient undergo evaluation at [place] , [facility], or [facility] for further evaluation and recommendations regarding patient's pain felt to be due to to neurofibromatosis. We will proceed with sphenopalatine ganglion block at time return appointment and will consider modifications of treatment regimen pending further assessment of patient's at tertiary clinic as discussed and as explained to patient on today's visit. All agreed to suggested treatment plan   Review of Systems     Objective:   Physical Exam  There was tenderness of the splenius capitis and occipitalis musculature regions palpation which be produced moderate discomfort. There were no new lesions of the head or neck noted. There was tenderness of the cervical facet cervical paraspinal musculature region a moderate degree. There was moderate tenderness of the trapezius levator scapula and rhomboid musculature regions. Palpation of the acromioclavicular and glenohumeral joint regions reproduces minimal discomfort. He with slightly decreased grip strength with Tinel and Phalen's maneuver reproducing minimal discomfort. Palpation over the  thoracic facet thoracic paraspinal musculature region was with moderate discomfort. There was no crepitus of the thoracic region Lateral bending rotation extension and palpation of the lumbar facets reproduce moderate discomfort. Straight leg raising was tolerates approximately 30 without increased pain with dorsiflexion noted. He was with tenderness to palpation without ballottement of the patella. There was negative anterior and posterior drawer signs. EHL strength of the lower extremities appeared to be within normal limits and no sensory deficit or dermatomal distribution detected. Was tenderness over the PSIS and PII S regions. Tenderness was noted to be moderate. Palpation over the greater trochanteric region iliotibial band region reproduced mild to moderate discomfort. Abdomen was nontender with no costovertebral tenderness noted.      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 No lumbar disc protrusions, spinal or foraminal stenosis  Neurofibromatosis  Seizure disorder     PLAN   Continue present medications  Sphenopalatine ganglion block to be performed at time of return appointment  F/U PCP Dr.Meridth Soles  for evaliation of  BP and general medical  condition  F/U surgical evaluation. Ask receptionist the date of your neurosurgical evaluation  Ask receptionist date of your appointment at [place] May schedule appointment at [facility] pain clinic or [facility] pain [facility] if more convenient for patient  F/U neurological evaluation. Follow-up Dr. Melrose Nakayama as discussed . Please mention Neurontin to Dr. Melrose Nakayama at time of your reevaluation  May  consider  radiofrequency rhizolysis or intraspinal procedures pending response to present treatment and F/U evaluation . We  will avoid these procedures at this time  Patient to call Pain Management Center should patient have concerns prior to scheduled return appointment.

## 2015-12-22 ENCOUNTER — Ambulatory Visit: Payer: Medicare Other | Attending: Pain Medicine | Admitting: Pain Medicine

## 2015-12-22 ENCOUNTER — Encounter: Payer: Self-pay | Admitting: Pain Medicine

## 2015-12-22 VITALS — BP 125/77 | HR 74 | Temp 98.5°F | Resp 16 | Ht 65.0 in | Wt 175.0 lb

## 2015-12-22 DIAGNOSIS — Q8500 Neurofibromatosis, unspecified: Secondary | ICD-10-CM

## 2015-12-22 DIAGNOSIS — M160 Bilateral primary osteoarthritis of hip: Secondary | ICD-10-CM

## 2015-12-22 DIAGNOSIS — M5481 Occipital neuralgia: Secondary | ICD-10-CM

## 2015-12-22 DIAGNOSIS — R51 Headache: Secondary | ICD-10-CM | POA: Insufficient documentation

## 2015-12-22 DIAGNOSIS — G43109 Migraine with aura, not intractable, without status migrainosus: Secondary | ICD-10-CM

## 2015-12-22 DIAGNOSIS — M533 Sacrococcygeal disorders, not elsewhere classified: Secondary | ICD-10-CM

## 2015-12-22 DIAGNOSIS — M170 Bilateral primary osteoarthritis of knee: Secondary | ICD-10-CM

## 2015-12-22 MED ORDER — LIDOCAINE HCL 4 % EX SOLN
CUTANEOUS | Status: AC
  Administered 2015-12-22: 11:00:00
  Filled 2015-12-22: qty 50

## 2015-12-22 NOTE — Progress Notes (Signed)
Patient here for procedure.

## 2015-12-22 NOTE — Progress Notes (Signed)
   Subjective:    Patient ID: Valerie Mckinney, female    DOB: Jun 27, 1981, 35 y.o.   MRN: [id number]  HPI                                                                                                                SPHENOPALATINE GANGLION BLOCK                                                                        The patient is a 35 -year-old female who comes to pain management for follow-up evaluation and treatment of headache. The patient has been felt to be with significant component of headaches due to migraine. Decision has been made to proceed with sphenopalatine ganglion block in attempt to decrease severity of patient's headaches, reduce the frequency of patient's headaches, and avoid the need for more extensive treatment. The risks, benefits, and expectations of the procedure have been discussed with patient and explained to patient and all are in agreement to proceed with sphenopalatine ganglion block as planned   Description Of Procedure:  Sphenopalatine Ganglion Block  The patient assumed the supine position with head hyperextended. EKG, blood pressure, pulse, and pulse oximetry monitors were all in place. Next 1 cc of 4% topical lidocaine was instilled in the right nostril followed by 1 cc of 4% lidocaine instilled in the left nostril while patient remained in the supine position with the head hyperextended The patient tolerated the administration of medication very well A cotton pledget soaked with 1 cc of 4% lidocaine was placed in the right nostril followed by a cotton pledget soaked with 1 cc of 4% lidocaine placed in the left nostril. The patient remained in the supine position with cotton pledgets in place for 35 minutes  The patient tolerated the procedure well  The patient stated that the headache was no longer present at completion of the procedure.    PLAN   Continue present medication  F/U PCP Dr Jerilynn Mages .Soles  for evaliation of  BP and general medical   condition  F/U surgical evaluation. May consider pending further evaluation  F/U neurological evaluation for further assessment and treatment of headaches has been addressed  May consider radiofrequency rhizolysis or intraspinal procedures pending response to present treatment and F/U evaluation   Patient to call Pain Management Center should patient have concerns prior to scheduled return appointment.    Review of Systems     Objective:   Physical Exam        Assessment & Plan:

## 2015-12-22 NOTE — Patient Instructions (Addendum)
PLAN   Continue present medications  F/U PCP Dr.Meridth Soles  for evaliation of  BP and general medical  condition  F/U surgical evaluation. Neurosurgical evaluation as planned  Appointment at [place] [facility] or [facility] as discussed  F/U neurological evaluation. Follow-up Dr. Melrose Nakayama as discussed  May consider radiofrequency rhizolysis or intraspinal procedures pending response to present treatment and F/U evaluation . We  will avoid these procedures at this time   Pain Management Discharge Instructions  General Discharge Instructions :  If you need to reach your doctor call: Monday-Friday 8:00 am - 4:00 pm at [phone] or toll free [phone].  After clinic hours [phone] to have operator reach doctor.  Bring all of your medication bottles to all your appointments in the pain clinic.  To cancel or reschedule your appointment with Pain Management please remember to call 24 hours in advance to avoid a fee.  Refer to the educational materials which you have been given on: General Risks, I had my Procedure. Discharge Instructions, Post Sedation.  Post Procedure Instructions:  The drugs you were given will stay in your system until tomorrow, so for the next 24 hours you should not drive, make any legal decisions or drink any alcoholic beverages.  You may eat anything you prefer, but it is better to start with liquids then soups and crackers, and gradually work up to solid foods.  Please notify your doctor immediately if you have any unusual bleeding, trouble breathing or pain that is not related to your normal pain.  Depending on the type of procedure that was done, some parts of your body may feel week and/or numb.  This usually clears up by tonight or the next day.  Walk with the use of an assistive device or accompanied by an adult for the 24 hours.  You may use ice on the affected area for the first 24 hours.  Put ice in a Ziploc bag  and cover with a towel and place against area 15 minutes on 15 minutes off.  You may switch to heat after 24 hours.

## 2015-12-23 ENCOUNTER — Telehealth: Payer: Self-pay | Admitting: *Deleted

## 2015-12-23 NOTE — Telephone Encounter (Signed)
Spoke with patient re; procedure on yesterday. Denies any question or concerns.

## 2016-01-12 ENCOUNTER — Encounter: Payer: Self-pay | Admitting: Pain Medicine

## 2016-01-12 ENCOUNTER — Ambulatory Visit: Payer: Medicare Other | Attending: Pain Medicine | Admitting: Pain Medicine

## 2016-01-12 VITALS — BP 123/81 | HR 83 | Temp 98.4°F | Resp 16 | Ht 65.0 in | Wt 175.0 lb

## 2016-01-12 DIAGNOSIS — Q8500 Neurofibromatosis, unspecified: Secondary | ICD-10-CM | POA: Diagnosis not present

## 2016-01-12 DIAGNOSIS — G43109 Migraine with aura, not intractable, without status migrainosus: Secondary | ICD-10-CM

## 2016-01-12 DIAGNOSIS — M160 Bilateral primary osteoarthritis of hip: Secondary | ICD-10-CM

## 2016-01-12 DIAGNOSIS — G43909 Migraine, unspecified, not intractable, without status migrainosus: Secondary | ICD-10-CM | POA: Diagnosis not present

## 2016-01-12 DIAGNOSIS — R51 Headache: Secondary | ICD-10-CM | POA: Diagnosis present

## 2016-01-12 DIAGNOSIS — M79602 Pain in left arm: Secondary | ICD-10-CM | POA: Diagnosis present

## 2016-01-12 DIAGNOSIS — M50221 Other cervical disc displacement at C4-C5 level: Secondary | ICD-10-CM | POA: Insufficient documentation

## 2016-01-12 DIAGNOSIS — G40909 Epilepsy, unspecified, not intractable, without status epilepticus: Secondary | ICD-10-CM | POA: Insufficient documentation

## 2016-01-12 DIAGNOSIS — M5481 Occipital neuralgia: Secondary | ICD-10-CM

## 2016-01-12 DIAGNOSIS — M79601 Pain in right arm: Secondary | ICD-10-CM | POA: Diagnosis present

## 2016-01-12 DIAGNOSIS — M47816 Spondylosis without myelopathy or radiculopathy, lumbar region: Secondary | ICD-10-CM | POA: Diagnosis not present

## 2016-01-12 DIAGNOSIS — M533 Sacrococcygeal disorders, not elsewhere classified: Secondary | ICD-10-CM

## 2016-01-12 DIAGNOSIS — M170 Bilateral primary osteoarthritis of knee: Secondary | ICD-10-CM

## 2016-01-12 DIAGNOSIS — M47812 Spondylosis without myelopathy or radiculopathy, cervical region: Secondary | ICD-10-CM | POA: Diagnosis not present

## 2016-01-12 NOTE — Progress Notes (Signed)
s  Subjective:    Patient ID: Valerie Mckinney, female    DOB: 12/15/1980, 35 y.o.   MRN: [id number]  HPI    The patient is a 35 year old female who returns to pain management for further evaluation and treatment of headache as well as pain involving the neck upper extremities and lower back and lower extremity region. The patient is with history of neurofibromatosis. We have requested that patient undergo evaluation at a tertiary pain clinic for further recommendations regarding patient's treatment regimen. The patient has had some improvement of her headaches with interventional treatment in pain management Center. At the present time patient states the pain radiates from the neck to the back of the head and continues forward. We will schedule patient for greater occipital nerve block to be performed at time of return appointment in attempt to decrease severity of patient's headaches, decrease progression of symptoms of patient's headache, and avoid the need for more involved treatment. The patient was with understanding and in agreement with suggested treatment plan.    Review of Systems     Objective:   Physical Exam   There was tenderness to palpation of the splenius capitis and occipitalis musculature region palpation which reproduces pain of moderate degree there were no bounding pulsations of the temporal region noted and no excessive tends to palpation over the region of the sinuses palpation of the temporomandibular joint region was without elicitation of significant pain is well. Palpation over the thoracic facet thoracic paraspinal musculature region reveal patient to be with moderate muscle spasm of the thoracic region. Palpation of the acromial clavicular and glenohumeral joint regions were without increased pain of significant degree. The patient was with bilaterally equal grip strength and Tinel and Phalen's maneuver were without increase of pain of significant degree. No crepitus  of the thoracic region was noted. Palpation over the lumbar paraspinal musculatures and lumbar facet region associated with increased pain with lateral bending rotation extension and palpation of the lumbar facets reproducing mild to moderate discomfort. There was mild to moderate tenderness of the PSIS and PII S region as well as the gluteal and piriformis musculature region with mild tenderness of the greater trochanteric region and iliotibial band region. Gait leg raise was tolerates approximately 30 without a definite increased pain with dorsiflexion noted. There was negative clonus negative Homans. Abdomen was nontender with no costovertebral tenderness noted.     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 No lumbar disc protrusions, spinal or foraminal stenosis  Neurofibromatosis  Seizure disorder     PLAN   Continue present medications  Greater occipital nerve blocks to be performed at time of return appointment  F/U PCP Dr.Meridth Soles  for evaliation of  BP and general medical  condition  F/U surgical evaluation. Ask receptionist the date of your neurosurgical evaluation  Ask receptionist date of your appointment at [place] May schedule appointment at [facility] pain clinic or [facility] pain clinic if more convenient for patient  F/U neurological evaluation. Follow-up Dr. Melrose Nakayama as discussed . Please mention Neurontin to Dr. Melrose Nakayama at time of your reevaluation as we previously discussed  May consider radiofrequency rhizolysis or intraspinal procedures pending  response to present treatment and F/U evaluation . We  will avoid these procedures at this  time  Patient to call Pain Management Center should patient have concerns prior to scheduled return appointment.

## 2016-01-12 NOTE — Patient Instructions (Addendum)
PLAN   Continue present medications  Greater occipital nerve blocks to be performed at time of return appointment  F/U PCP Dr.Meridth Soles  for evaliation of  BP and general medical  condition  F/U surgical evaluation. Ask receptionist the date of your neurosurgical evaluation  Ask receptionist date of your appointment at [facility] May schedule appointment at [facility] pain clinic or [facility] pain clinic if more convenient for patient  F/U neurological evaluation. Follow-up Dr. Melrose Nakayama as discussed . Please mention Neurontin to Dr. Melrose Nakayama at time of your reevaluation as we previously discussed  May consider radiofrequency rhizolysis or intraspinal procedures pending response to present treatment and F/U evaluation . We  will avoid these procedures at this time  Patient to call Pain Management Center should patient have concerns prior to scheduled return appointment. Occipital Nerve Block Patient Information  Description: The occipital nerves originate in the cervical (neck) spinal cord and travel upward through muscle and tissue to supply sensation to the back of the head and top of the scalp.  In addition, the nerves control some of the muscles of the scalp.  Occipital neuralgia is an irritation of these nerves which can cause headaches, numbness of the scalp, and neck discomfort.     The occipital nerve block will interrupt nerve transmission through these nerves and can relieve pain and spasm.  The block consists of insertion of a small needle under the skin in the back of the head to deposit local anesthetic (numbing medicine) and/or steroids around the nerve.  The entire block usually lasts less than 5 minutes.  Conditions which may be treated by occipital blocks:   Muscular pain and spasm of the scalp  Nerve irritation, back of the head  Headaches  Upper neck pain  Preparation for the injection:  1. Do not eat any solid food or dairy products within 8 hours of your  appointment. 2. You may drink clear liquids up to 3 hours before appointment.  Clear liquids include water, black coffee, juice or soda.  No milk or cream please. 3. You may take your regular medication, including pain medications, with a sip of water before you appointment.  Diabetics should hold regular insulin (if taken separately) and take 1/2 normal NPH dose the morning of the procedure.  Carry some sugar containing items with you to your appointment. 4. A driver must accompany you and be prepared to drive you home after your procedure. 5. Bring all your current medications with you. 6. An IV may be inserted and sedation may be given at the discretion of the physician. 7. A blood pressure cuff, EKG, and other monitors will often be applied during the procedure.  Some patients may need to have extra oxygen administered for a short period. 8. You will be asked to provide medical information, including your allergies and medications, prior to the procedure.  We must know immediately if you are taking blood thinners (like Coumadin/Warfarin) or if you are allergic to IV iodine contrast (dye).  We must know if you could possible be pregnant.  9. Do not wear a high collared shirt or turtleneck.  Tie long hair up in the back if possible.  Possible side-effects:   Bleeding from needle site  Infection (rare, may require surgery)  Nerve injury (rare)  Hair on back of neck can be tinged with iodine scrub (this will wash out)  Light-headedness (temporary)  Pain at injection site (several days)  Decreased blood pressure (rare, temporary)  Seizure (  very rare)  Call if you experience:   Hives or difficulty breathing ( go to the emergency room)  Inflammation or drainage at the injection site(s)  Please note:  Although the local anesthetic injected can often make your painful muscles or headache feel good for several hours after the injection, the pain may return.  It takes 3-7 days for  steroids to work.  You may not notice any pain relief for at least one week.  If effective, we will often do a series of injections spaced 3-6 weeks apart to maximally decrease your pain.  If you have any questions, please call [phone] [place]  What are the risk, side effects and possible complications? Generally speaking, most procedures are safe.  However, with any procedure there are risks, side effects, and the possibility of complications.  The risks and complications are dependent upon the sites that are lesioned, or the type of nerve block to be performed.  The closer the procedure is to the spine, the more serious the risks are.  Great care is taken when placing the radio frequency needles, block needles or lesioning probes, but sometimes complications can occur. 1. Infection: Any time there is an injection through the skin, there is a risk of infection.  This is why sterile conditions are used for these blocks.  There are four possible types of infection. 1. Localized skin infection. 2. Central Nervous System Infection-This can be in the form of Meningitis, which can be deadly. 3. Epidural Infections-This can be in the form of an epidural abscess, which can cause pressure inside of the spine, causing compression of the spinal cord with subsequent paralysis. This would require an emergency surgery to decompress, and there are no guarantees that the patient would recover from the paralysis. 4. Discitis-This is an infection of the intervertebral discs.  It occurs in about 1% of discography procedures.  It is difficult to treat and it may lead to surgery.        2. Pain: the needles have to go through skin and soft tissues, will cause soreness.       3. Damage to internal structures:  The nerves to be lesioned may be near blood vessels or    other nerves which can be potentially damaged.       4. Bleeding: Bleeding is  more common if the patient is taking blood thinners such as  aspirin, Coumadin, Ticiid, Plavix, etc., or if he/she have some genetic predisposition  such as hemophilia. Bleeding into the spinal canal can cause compression of the spinal  cord with subsequent paralysis.  This would require an emergency surgery to  decompress and there are no guarantees that the patient would recover from the  paralysis.       5. Pneumothorax:  Puncturing of a lung is a possibility, every time a needle is introduced in  the area of the chest or upper back.  Pneumothorax refers to free air around the  collapsed lung(s), inside of the thoracic cavity (chest cavity).  Another two possible  complications related to a similar event would include: Hemothorax and Chylothorax.   These are variations of the Pneumothorax, where instead of air around the collapsed  lung(s), you may have blood or chyle, respectively.       6. Spinal headaches: They may occur with any procedures in the area of the spine.       7. Persistent CSF (Cerebro-Spinal Fluid)  leakage: This is a rare problem, but may occur  with prolonged intrathecal or epidural catheters either due to the formation of a fistulous  track or a dural tear.       8. Nerve damage: By working so close to the spinal cord, there is always a possibility of  nerve damage, which could be as serious as a permanent spinal cord injury with  paralysis.       9. Death:  Although rare, severe deadly allergic reactions known as "Anaphylactic  reaction" can occur to any of the medications used.      10. Worsening of the symptoms:  We can always make thing worse.  What are the chances of something like this happening? Chances of any of this occuring are extremely low.  By statistics, you have more of a chance of getting killed in a motor vehicle accident: while driving to the hospital than any of the above occurring .  Nevertheless, you should be aware that they are possibilities.  In general, it is  similar to taking a shower.  Everybody knows that you can slip, hit your head and get killed.  Does that mean that you should not shower again?  Nevertheless always keep in mind that statistics do not mean anything if you happen to be on the wrong side of them.  Even if a procedure has a 1 (one) in a 1,000,000 (million) chance of going wrong, it you happen to be that one..Also, keep in mind that by statistics, you have more of a chance of having something go wrong when taking medications.  Who should not have this procedure? If you are on a blood thinning medication (e.g. Coumadin, Plavix, see list of "Blood Thinners"), or if you have an active infection going on, you should not have the procedure.  If you are taking any blood thinners, please inform your physician.  How should I prepare for this procedure?  Do not eat or drink anything at least six hours prior to the procedure.  Bring a driver with you .  It cannot be a taxi.  Come accompanied by an adult that can drive you back, and that is strong enough to help you if your legs get weak or numb from the local anesthetic.  Take all of your medicines the morning of the procedure with just enough water to swallow them.  If you have diabetes, make sure that you are scheduled to have your procedure done first thing in the morning, whenever possible.  If you have diabetes, take only half of your insulin dose and notify our nurse that you have done so as soon as you arrive at the clinic.  If you are diabetic, but only take blood sugar pills (oral hypoglycemic), then do not take them on the morning of your procedure.  You may take them after you have had the procedure.  Do not take aspirin or any aspirin-containing medications, at least eleven (11) days prior to the procedure.  They may prolong bleeding.  Wear loose fitting clothing that may be easy to take off and that you would not mind if it got stained with Betadine or blood.  Do not wear any  jewelry or perfume  Remove any nail coloring.  It will interfere with some of our monitoring equipment.  NOTE: Remember that this is not meant to be interpreted as a complete list of all possible complications.  Unforeseen problems may occur.  BLOOD THINNERS The following drugs contain aspirin  or other products, which can cause increased bleeding during surgery and should not be taken for 2 weeks prior to and 1 week after surgery.  If you should need take something for relief of minor pain, you may take acetaminophen which is found in Tylenol,m Datril, Anacin-3 and Panadol. It is not blood thinner. The products listed below are.  Do not take any of the products listed below in addition to any listed on your instruction sheet.  A.P.C or A.P.C with Codeine Codeine Phosphate Capsules #3 Ibuprofen Ridaura  ABC compound Congesprin Imuran rimadil  Advil Cope Indocin Robaxisal  Alka-Seltzer Effervescent Pain Reliever and Antacid Coricidin or Coricidin-D  Indomethacin Rufen  Alka-Seltzer plus Cold Medicine Cosprin Ketoprofen S-A-C Tablets  Anacin Analgesic Tablets or Capsules Coumadin Korlgesic Salflex  Anacin Extra Strength Analgesic tablets or capsules CP-2 Tablets Lanoril Salicylate  Anaprox Cuprimine Capsules Levenox Salocol  Anexsia-D Dalteparin Magan Salsalate  Anodynos Darvon compound Magnesium Salicylate Sine-off  Ansaid Dasin Capsules Magsal Sodium Salicylate  Anturane Depen Capsules Marnal Soma  APF Arthritis pain formula Dewitt's Pills Measurin Stanback  Argesic Dia-Gesic Meclofenamic Sulfinpyrazone  Arthritis Bayer Timed Release Aspirin Diclofenac Meclomen Sulindac  Arthritis pain formula Anacin Dicumarol Medipren Supac  Analgesic (Safety coated) Arthralgen Diffunasal Mefanamic Suprofen  Arthritis Strength Bufferin Dihydrocodeine Mepro Compound Suprol  Arthropan liquid Dopirydamole Methcarbomol with Aspirin Synalgos  ASA tablets/Enseals Disalcid Micrainin Tagament  Ascriptin Doan's  Midol Talwin  Ascriptin A/D Dolene Mobidin Tanderil  Ascriptin Extra Strength Dolobid Moblgesic Ticlid  Ascriptin with Codeine Doloprin or Doloprin with Codeine Momentum Tolectin  Asperbuf Duoprin Mono-gesic Trendar  Aspergum Duradyne Motrin or Motrin IB Triminicin  Aspirin plain, buffered or enteric coated Durasal Myochrisine Trigesic  Aspirin Suppositories Easprin Nalfon Trillsate  Aspirin with Codeine Ecotrin Regular or Extra Strength Naprosyn Uracel  Atromid-S Efficin Naproxen Ursinus  Auranofin Capsules Elmiron Neocylate Vanquish  Axotal Emagrin Norgesic Verin  Azathioprine Empirin or Empirin with Codeine Normiflo Vitamin E  Azolid Emprazil Nuprin Voltaren  Bayer Aspirin plain, buffered or children's or timed BC Tablets or powders Encaprin Orgaran Warfarin Sodium  Buff-a-Comp Enoxaparin Orudis Zorpin  Buff-a-Comp with Codeine Equegesic Os-Cal-Gesic   Buffaprin Excedrin plain, buffered or Extra Strength Oxalid   Bufferin Arthritis Strength Feldene Oxphenbutazone   Bufferin plain or Extra Strength Feldene Capsules Oxycodone with Aspirin   Bufferin with Codeine Fenoprofen Fenoprofen Pabalate or Pabalate-SF   Buffets II Flogesic Panagesic   Buffinol plain or Extra Strength Florinal or Florinal with Codeine Panwarfarin   Buf-Tabs Flurbiprofen Penicillamine   Butalbital Compound Four-way cold tablets Penicillin   Butazolidin Fragmin Pepto-Bismol   Carbenicillin Geminisyn Percodan   Carna Arthritis Reliever Geopen Persantine   Carprofen Gold's salt Persistin   Chloramphenicol Goody's Phenylbutazone   Chloromycetin Haltrain Piroxlcam   Clmetidine heparin Plaquenil   Cllnoril Hyco-pap Ponstel   Clofibrate Hydroxy chloroquine Propoxyphen         Before stopping any of these medications, be sure to consult the physician who ordered them.  Some, such as Coumadin (Warfarin) are ordered to prevent or treat serious conditions such as "deep thrombosis", "pumonary embolisms", and other heart  problems.  The amount of time that you may need off of the medication may also vary with the medication and the reason for which you were taking it.  If you are taking any of these medications, please make sure you notify your pain physician before you undergo any procedures.

## 2016-01-12 NOTE — Progress Notes (Signed)
Patient here for f/up after procedure.   Safety precautions to be maintained throughout the outpatient stay will include: orient to surroundings, keep bed in low position, maintain call bell within reach at all times, provide assistance with transfer out of bed and ambulation.

## 2016-01-17 ENCOUNTER — Encounter: Payer: Self-pay | Admitting: Pain Medicine

## 2016-01-17 ENCOUNTER — Ambulatory Visit: Payer: Medicare Other | Attending: Pain Medicine | Admitting: Pain Medicine

## 2016-01-17 VITALS — BP 116/79 | HR 82 | Temp 98.7°F | Resp 18 | Ht 65.0 in | Wt 175.0 lb

## 2016-01-17 DIAGNOSIS — R51 Headache: Secondary | ICD-10-CM | POA: Diagnosis present

## 2016-01-17 DIAGNOSIS — G43109 Migraine with aura, not intractable, without status migrainosus: Secondary | ICD-10-CM

## 2016-01-17 DIAGNOSIS — M542 Cervicalgia: Secondary | ICD-10-CM | POA: Diagnosis not present

## 2016-01-17 DIAGNOSIS — Q8500 Neurofibromatosis, unspecified: Secondary | ICD-10-CM

## 2016-01-17 DIAGNOSIS — M533 Sacrococcygeal disorders, not elsewhere classified: Secondary | ICD-10-CM

## 2016-01-17 DIAGNOSIS — M160 Bilateral primary osteoarthritis of hip: Secondary | ICD-10-CM

## 2016-01-17 DIAGNOSIS — M5481 Occipital neuralgia: Secondary | ICD-10-CM

## 2016-01-17 DIAGNOSIS — M170 Bilateral primary osteoarthritis of knee: Secondary | ICD-10-CM

## 2016-01-17 MED ORDER — TRIAMCINOLONE ACETONIDE 40 MG/ML IJ SUSP
INTRAMUSCULAR | Status: AC
  Filled 2016-01-17: qty 1

## 2016-01-17 MED ORDER — TRIAMCINOLONE ACETONIDE 40 MG/ML IJ SUSP: 40.0000 mg | Freq: Once | INTRAMUSCULAR | Status: DC

## 2016-01-17 MED ORDER — BUPIVACAINE HCL (PF) 0.25 % IJ SOLN
INTRAMUSCULAR | Status: AC
  Filled 2016-01-17: qty 30

## 2016-01-17 MED ORDER — BUPIVACAINE HCL (PF) 0.25 % IJ SOLN: 30.0000 mL | Freq: Once | INTRAMUSCULAR | Status: DC

## 2016-01-17 NOTE — Progress Notes (Signed)
Safety precautions to be maintained throughout the outpatient stay will include: orient to surroundings, keep bed in low position, maintain call bell within reach at all times, provide assistance with transfer out of bed and ambulation.  

## 2016-01-17 NOTE — Progress Notes (Signed)
   Subjective:    Patient ID: Valerie Mckinney, female    DOB: 1981-07-25, 35 y.o.   MRN: [id number]  HPI  NOTE: The patient is a 35 y.o.-year-old female who returns to the [facility] for further evaluation and treatment of pain consisting of pain involving the region of the neck and headache.  Patient is with headache that begins the back of the neck and continues to the back of the head and protracted the retro-orbital region. 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 .  The risks, benefits, and expectations of the procedure have been discussed and explained to patient, who is understanding and wishes to proceed with interventional treatment as discussed and as explained to patient.  Will proceed with greater occipital nerve blocks with myoneural block injections at this time as discussed and as explained to patient.  All are understanding and in agreement with suggested treatment plan.    PROCEDURE:  Greater occipital nerve block on the left side with EKG, blood pressure, pulse, pulse oximetry monitoring.  Procedure performed with patient in prone position.  Greater occipital nerve block on the left side.   With patient in prone position, Betadine prep of proposed entry site accomplished.  Following identification of the nuchal ridge, 22 -gauge needle was inserted at the level of the nuchal ridge medial to the occipital artery.  Following negative aspiration, 4cc 0.25% bupivacaine with Kenalog injected for left greater occipital nerve block.  Needle was removed.  Patient tolerated injection well.   Greater occipital nerve block on the rightt side. The greater occipital nerve block on the right side was performed exactly as the left greater occipital nerve block was performed and utilizing the same technique.   A total of 10 mg Kenalog was utilized for the entire procedure.  PLAN:     1. Medications: Will continue presently prescribed medications at this time. 2. Patient to follow up with primary care physician M Soles for evaluation of blood pressure and general medical condition status post procedure performed on today's visit. 3. Neurological evaluation for further assessment of headaches for further studies as discussed. 4. Surgical evaluation as discussed. We will avoid surgical evaluation at this time 5. Appointment at [facility] as planned 6. Patient may be candidate for Botox injections, radiofrequency procedures, as well as implantation type procedures pending response to treatment rendered on today's visit and pending follow-up evaluation. 7. Patient has been advised to adhere to proper body mechanics and to avoid activities which appear to aggravate condition.cations:  Will continue presently prescribed medications at this time. 8. The patient is understanding and in agreement with the suggested treatment plan.   Review of Systems     Objective:   Physical Exam        Assessment & Plan:

## 2016-01-17 NOTE — Patient Instructions (Addendum)
PLAN   Continue present medications  F/U PCP Dr.Meridth Soles  for evaliation of  BP and general medical  condition  F/U surgical evaluation. Ask receptionist the date of your neurosurgical evaluation as we previously discussed  Ask receptionist date of your appointment at [place] May schedule appointment at [facility] pain clinic or [facility] pain clinic if more convenient   F/U neurological evaluation. Follow-up Dr. Melrose Nakayama as discussed . Please mention Neurontin to Dr. Melrose Nakayama at time of your reevaluation as we previously discussed  May consider radiofrequency rhizolysis or intraspinal procedures pending response to present treatment and F/U evaluation . We  will avoid these procedures at this time  Patient to call Pain Management Center should patient have concerns prior to scheduled return appointment.Pain Management Discharge Instructions  General Discharge Instructions :  If you need to reach your doctor call: Monday-Friday 8:00 am - 4:00 pm at [phone] or toll free [phone].  After clinic hours [phone] to have operator reach doctor.  Bring all of your medication bottles to all your appointments in the pain clinic.  To cancel or reschedule your appointment with Pain Management please remember to call 24 hours in advance to avoid a fee.  Refer to the educational materials which you have been given on: General Risks, I had my Procedure. Discharge Instructions, Post Sedation.  Post Procedure Instructions:  The drugs you were given will stay in your system until tomorrow, so for the next 24 hours you should not drive, make any legal decisions or drink any alcoholic beverages.  You may eat anything you prefer, but it is better to start with liquids then soups and crackers, and gradually work up to solid foods.  Please notify your doctor immediately if you have any unusual bleeding, trouble breathing or pain that is not related to your normal pain.  Depending on  the type of procedure that was done, some parts of your body may feel week and/or numb.  This usually clears up by tonight or the next day.  Walk with the use of an assistive device or accompanied by an adult for the 24 hours.  You may use ice on the affected area for the first 24 hours.  Put ice in a Ziploc bag and cover with a towel and place against area 15 minutes on 15 minutes off.  You may switch to heat after 24 hours.

## 2016-02-08 ENCOUNTER — Encounter: Payer: Self-pay | Admitting: Pain Medicine

## 2016-02-08 ENCOUNTER — Ambulatory Visit: Payer: Medicare Other | Attending: Pain Medicine | Admitting: Pain Medicine

## 2016-02-08 VITALS — BP 115/83 | HR 81 | Temp 98.8°F | Resp 18 | Ht 65.0 in | Wt 175.0 lb

## 2016-02-08 DIAGNOSIS — M5136 Other intervertebral disc degeneration, lumbar region: Secondary | ICD-10-CM | POA: Diagnosis not present

## 2016-02-08 DIAGNOSIS — G43909 Migraine, unspecified, not intractable, without status migrainosus: Secondary | ICD-10-CM | POA: Diagnosis not present

## 2016-02-08 DIAGNOSIS — G40909 Epilepsy, unspecified, not intractable, without status epilepticus: Secondary | ICD-10-CM | POA: Diagnosis not present

## 2016-02-08 DIAGNOSIS — M50221 Other cervical disc displacement at C4-C5 level: Secondary | ICD-10-CM | POA: Diagnosis not present

## 2016-02-08 DIAGNOSIS — M5481 Occipital neuralgia: Secondary | ICD-10-CM | POA: Diagnosis not present

## 2016-02-08 DIAGNOSIS — G43109 Migraine with aura, not intractable, without status migrainosus: Secondary | ICD-10-CM

## 2016-02-08 DIAGNOSIS — R51 Headache: Secondary | ICD-10-CM | POA: Diagnosis present

## 2016-02-08 DIAGNOSIS — M533 Sacrococcygeal disorders, not elsewhere classified: Secondary | ICD-10-CM | POA: Diagnosis not present

## 2016-02-08 DIAGNOSIS — Q8500 Neurofibromatosis, unspecified: Secondary | ICD-10-CM | POA: Diagnosis not present

## 2016-02-08 DIAGNOSIS — M170 Bilateral primary osteoarthritis of knee: Secondary | ICD-10-CM

## 2016-02-08 DIAGNOSIS — M160 Bilateral primary osteoarthritis of hip: Secondary | ICD-10-CM

## 2016-02-08 DIAGNOSIS — M5030 Other cervical disc degeneration, unspecified cervical region: Secondary | ICD-10-CM | POA: Diagnosis not present

## 2016-02-08 NOTE — Progress Notes (Signed)
Subjective:    Patient ID: Valerie Mckinney, female    DOB: 1981/04/20, 35 y.o.   MRN: [id number]  HPI  The patient is a 35 year old female who returns to pain management for further evaluation and treatment of headaches as well as pain involving the neck entire back upper and lower extremity regions. The patient has history of neurofibromatosis. At the present time patient states she has had improvement of her pain involving headaches with prior interventional treatment. The patient states that the most effective treatment appeared to be the greater occipital nerve block. We discussed patient's condition and will proceed with greater occipital nerve block to be performed at time return appointment. We will also discussed patient's lumbar lower extremity pain and we will have patient undergo evaluation at [place] at this time. We will consider modification of treatment regimen once we have received evaluation from [place]. The patient states that headaches occurring the back of the neck radiating to the back of the head and continuing forward to behind the eyes. There is concern regarding component of headaches due to bilateral occipital neuralgia. Patient also component of migraine headache. We'll proceed with bilateral occipital nerve blocks in attempt to decrease severity of headaches, decreased frequency of headaches, and decreased the frequency of migraine headaches as well. The patient was with understanding and agreed to suggested treatment plan.  Review of Systems     Objective:   Physical Exam   There was tenderness over the paraspinal musculature region the cervical region cervical facet region palpation which be produced pain of mild degree with moderate tenderness to palpation over the splenius capitis and occipitalis muscles regions. Palpation of the splenius capitis and occipitalis musculature regions reproduced pain of moderate to moderately severe  degree. There was tenderness of the cervical facet cervical paraspinal musculature region a moderate degree with moderate tends to palpation of the cervical facet cervical paraspinal muscular treat on the left as well as on the right. There was tenderness of the acromioclavicular and glenohumeral joint regions a moderate degree with moderate muscle spasm on the left as well as on the right. The patient appeared to be with unremarkable drop test and unremarkable Spurling's maneuver. Tinel and Phalen's maneuver were without increase of pain of significant degree and patient appeared to be with slightly decreased grip strength. Palpation over the thoracic region thoracic facet region was with no crepitus of the thoracic region noted. Palpation over the lumbar paraspinal musculatures and lumbar facet region was attends to palpation of moderate degree with lateral bending rotation extension and palpation over the lumbar facets reproducing moderate discomfort. There was mild to moderate tenderness of the PSIS and PII S region as well as the gluteal and piriformis musculature regions. There was moderate tenderness to palpation of the greater trochanteric region and iliotibial band region. Straight leg raise was tolerates approximately 30 without increase of pain with dorsiflexion noted. DTRs appeared to be trace at the knees. EHL appeared to be slightly decreased. No definite sensory deficit or dermatomal distribution detected. There was negative clonus negative Homans. Abdomen was nontender with no costovertebral tenderness noted.     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 No lumbar disc  protrusions, spinal or foraminal stenosis  Neurofibromatosis  Seizure disorder       PLAN   Continue present medications  Greater occipital nerve block to be performed at time of return appointment  F/U PCP Dr.Meridth Soles  for evaliation of  BP and general medical  condition  F/U surgical evaluation. Ask receptionist the date of your neurosurgical evaluation as we previously discussed  Ask receptionist date of your appointment at [place] May schedule appointment at [facility] pain clinic or [facility] pain clinic if more convenient   F/U neurological evaluation. Follow-up Dr. Melrose Nakayama as discussed . Please mention Neurontin to Dr. Melrose Nakayama at time of your reevaluation as we previously discussed  May consider radiofrequency rhizolysis or intraspinal procedures pending response to present treatment and F/U evaluation . We  will avoid these procedures at this time  Patient to call [facility] should patient have concerns prior to scheduled return appointment.

## 2016-02-08 NOTE — Progress Notes (Signed)
Safety precautions to be maintained throughout the outpatient stay will include: orient to surroundings, keep bed in low position, maintain call bell within reach at all times, provide assistance with transfer out of bed and ambulation.  

## 2016-02-08 NOTE — Patient Instructions (Addendum)
PLAN   Continue present medications  Greater occipital nerve block to be performed at time of return appointment  F/U PCP Dr.Meridth Soles  for evaliation of  BP and general medical  condition  F/U surgical evaluation. Ask receptionist the date of your neurosurgical evaluation as we previously discussed  Ask receptionist date of your appointment at [place] May schedule appointment at [facility] pain clinic or [facility] pain clinic if more convenient   F/U neurological evaluation. Follow-up Dr. Melrose Nakayama as discussed . Please mention Neurontin to Dr. Melrose Nakayama at time of your reevaluation as we previously discussed  May consider radiofrequency rhizolysis or intraspinal procedures pending response to present treatment and F/U evaluation . We  will avoid these procedures at this time  Patient to call Pain Management Center should patient have concerns prior to scheduled return appointment.Occipital Nerve Block Patient Information  Description: The occipital nerves originate in the cervical (neck) spinal cord and travel upward through muscle and tissue to supply sensation to the back of the head and top of the scalp.  In addition, the nerves control some of the muscles of the scalp.  Occipital neuralgia is an irritation of these nerves which can cause headaches, numbness of the scalp, and neck discomfort.     The occipital nerve block will interrupt nerve transmission through these nerves and can relieve pain and spasm.  The block consists of insertion of a small needle under the skin in the back of the head to deposit local anesthetic (numbing medicine) and/or steroids around the nerve.  The entire block usually lasts less than 5 minutes.  Conditions which may be treated by occipital blocks:   Muscular pain and spasm of the scalp  Nerve irritation, back of the head  Headaches  Upper neck pain  Preparation for the injection:  1. Do not eat any solid food or dairy products within 8 hours  of your appointment. 2. You may drink clear liquids up to 3 hours before appointment.  Clear liquids include water, black coffee, juice or soda.  No milk or cream please. 3. You may take your regular medication, including pain medications, with a sip of water before you appointment.  Diabetics should hold regular insulin (if taken separately) and take 1/2 normal NPH dose the morning of the procedure.  Carry some sugar containing items with you to your appointment. 4. A driver must accompany you and be prepared to drive you home after your procedure. 5. Bring all your current medications with you. 6. An IV may be inserted and sedation may be given at the discretion of the physician. 7. A blood pressure cuff, EKG, and other monitors will often be applied during the procedure.  Some patients may need to have extra oxygen administered for a short period. 8. You will be asked to provide medical information, including your allergies and medications, prior to the procedure.  We must know immediately if you are taking blood thinners (like Coumadin/Warfarin) or if you are allergic to IV iodine contrast (dye).  We must know if you could possible be pregnant.  9. Do not wear a high collared shirt or turtleneck.  Tie long hair up in the back if possible.  Possible side-effects:   Bleeding from needle site  Infection (rare, may require surgery)  Nerve injury (rare)  Hair on back of neck can be tinged with iodine scrub (this will wash out)  Light-headedness (temporary)  Pain at injection site (several days)  Decreased blood pressure (rare, temporary)  Seizure (very rare)  Call if you experience:   Hives or difficulty breathing ( go to the emergency room)  Inflammation or drainage at the injection site(s)  Please note:  Although the local anesthetic injected can often make your painful muscles or headache feel good for several hours after the injection, the pain may return.  It takes 3-7 days  for steroids to work.  You may not notice any pain relief for at least one week.  If effective, we will often do a series of injections spaced 3-6 weeks apart to maximally decrease your pain.  If you have any questions, please call [phone] [place]  What are the risk, side effects and possible complications? Generally speaking, most procedures are safe.  However, with any procedure there are risks, side effects, and the possibility of complications.  The risks and complications are dependent upon the sites that are lesioned, or the type of nerve block to be performed.  The closer the procedure is to the spine, the more serious the risks are.  Great care is taken when placing the radio frequency needles, block needles or lesioning probes, but sometimes complications can occur. 1. Infection: Any time there is an injection through the skin, there is a risk of infection.  This is why sterile conditions are used for these blocks.  There are four possible types of infection. 1. Localized skin infection. 2. Central Nervous System Infection-This can be in the form of Meningitis, which can be deadly. 3. Epidural Infections-This can be in the form of an epidural abscess, which can cause pressure inside of the spine, causing compression of the spinal cord with subsequent paralysis. This would require an emergency surgery to decompress, and there are no guarantees that the patient would recover from the paralysis. 4. Discitis-This is an infection of the intervertebral discs.  It occurs in about 1% of discography procedures.  It is difficult to treat and it may lead to surgery.        2. Pain: the needles have to go through skin and soft tissues, will cause soreness.       3. Damage to internal structures:  The nerves to be lesioned may be near blood vessels or    other nerves which can be potentially damaged.       4. Bleeding:  Bleeding is more common if the patient is taking blood thinners such as  aspirin, Coumadin, Ticiid, Plavix, etc., or if he/she have some genetic predisposition  such as hemophilia. Bleeding into the spinal canal can cause compression of the spinal  cord with subsequent paralysis.  This would require an emergency surgery to  decompress and there are no guarantees that the patient would recover from the  paralysis.       5. Pneumothorax:  Puncturing of a lung is a possibility, every time a needle is introduced in  the area of the chest or upper back.  Pneumothorax refers to free air around the  collapsed lung(s), inside of the thoracic cavity (chest cavity).  Another two possible  complications related to a similar event would include: Hemothorax and Chylothorax.   These are variations of the Pneumothorax, where instead of air around the collapsed  lung(s), you may have blood or chyle, respectively.       6. Spinal headaches: They may occur with any procedures in the area of the spine.       7. Persistent CSF (Cerebro-Spinal  Fluid) leakage: This is a rare problem, but may occur  with prolonged intrathecal or epidural catheters either due to the formation of a fistulous  track or a dural tear.       8. Nerve damage: By working so close to the spinal cord, there is always a possibility of  nerve damage, which could be as serious as a permanent spinal cord injury with  paralysis.       9. Death:  Although rare, severe deadly allergic reactions known as "Anaphylactic  reaction" can occur to any of the medications used.      10. Worsening of the symptoms:  We can always make thing worse.  What are the chances of something like this happening? Chances of any of this occuring are extremely low.  By statistics, you have more of a chance of getting killed in a motor vehicle accident: while driving to the hospital than any of the above occurring .  Nevertheless, you should be aware that they are possibilities.  In  general, it is similar to taking a shower.  Everybody knows that you can slip, hit your head and get killed.  Does that mean that you should not shower again?  Nevertheless always keep in mind that statistics do not mean anything if you happen to be on the wrong side of them.  Even if a procedure has a 1 (one) in a 1,000,000 (million) chance of going wrong, it you happen to be that one..Also, keep in mind that by statistics, you have more of a chance of having something go wrong when taking medications.  Who should not have this procedure? If you are on a blood thinning medication (e.g. Coumadin, Plavix, see list of "Blood Thinners"), or if you have an active infection going on, you should not have the procedure.  If you are taking any blood thinners, please inform your physician.  How should I prepare for this procedure?  Do not eat or drink anything at least six hours prior to the procedure.  Bring a driver with you .  It cannot be a taxi.  Come accompanied by an adult that can drive you back, and that is strong enough to help you if your legs get weak or numb from the local anesthetic.  Take all of your medicines the morning of the procedure with just enough water to swallow them.  If you have diabetes, make sure that you are scheduled to have your procedure done first thing in the morning, whenever possible.  If you have diabetes, take only half of your insulin dose and notify our nurse that you have done so as soon as you arrive at the clinic.  If you are diabetic, but only take blood sugar pills (oral hypoglycemic), then do not take them on the morning of your procedure.  You may take them after you have had the procedure.  Do not take aspirin or any aspirin-containing medications, at least eleven (11) days prior to the procedure.  They may prolong bleeding.  Wear loose fitting clothing that may be easy to take off and that you would not mind if it got stained with Betadine or  blood.  Do not wear any jewelry or perfume  Remove any nail coloring.  It will interfere with some of our monitoring equipment.  NOTE: Remember that this is not meant to be interpreted as a complete list of all possible complications.  Unforeseen problems may occur.  BLOOD THINNERS The following drugs contain  aspirin or other products, which can cause increased bleeding during surgery and should not be taken for 2 weeks prior to and 1 week after surgery.  If you should need take something for relief of minor pain, you may take acetaminophen which is found in Tylenol,m Datril, Anacin-3 and Panadol. It is not blood thinner. The products listed below are.  Do not take any of the products listed below in addition to any listed on your instruction sheet.  A.P.C or A.P.C with Codeine Codeine Phosphate Capsules #3 Ibuprofen Ridaura  ABC compound Congesprin Imuran rimadil  Advil Cope Indocin Robaxisal  Alka-Seltzer Effervescent Pain Reliever and Antacid Coricidin or Coricidin-D  Indomethacin Rufen  Alka-Seltzer plus Cold Medicine Cosprin Ketoprofen S-A-C Tablets  Anacin Analgesic Tablets or Capsules Coumadin Korlgesic Salflex  Anacin Extra Strength Analgesic tablets or capsules CP-2 Tablets Lanoril Salicylate  Anaprox Cuprimine Capsules Levenox Salocol  Anexsia-D Dalteparin Magan Salsalate  Anodynos Darvon compound Magnesium Salicylate Sine-off  Ansaid Dasin Capsules Magsal Sodium Salicylate  Anturane Depen Capsules Marnal Soma  APF Arthritis pain formula Dewitt's Pills Measurin Stanback  Argesic Dia-Gesic Meclofenamic Sulfinpyrazone  Arthritis Bayer Timed Release Aspirin Diclofenac Meclomen Sulindac  Arthritis pain formula Anacin Dicumarol Medipren Supac  Analgesic (Safety coated) Arthralgen Diffunasal Mefanamic Suprofen  Arthritis Strength Bufferin Dihydrocodeine Mepro Compound Suprol  Arthropan liquid Dopirydamole Methcarbomol with Aspirin Synalgos  ASA tablets/Enseals Disalcid Micrainin  Tagament  Ascriptin Doan's Midol Talwin  Ascriptin A/D Dolene Mobidin Tanderil  Ascriptin Extra Strength Dolobid Moblgesic Ticlid  Ascriptin with Codeine Doloprin or Doloprin with Codeine Momentum Tolectin  Asperbuf Duoprin Mono-gesic Trendar  Aspergum Duradyne Motrin or Motrin IB Triminicin  Aspirin plain, buffered or enteric coated Durasal Myochrisine Trigesic  Aspirin Suppositories Easprin Nalfon Trillsate  Aspirin with Codeine Ecotrin Regular or Extra Strength Naprosyn Uracel  Atromid-S Efficin Naproxen Ursinus  Auranofin Capsules Elmiron Neocylate Vanquish  Axotal Emagrin Norgesic Verin  Azathioprine Empirin or Empirin with Codeine Normiflo Vitamin E  Azolid Emprazil Nuprin Voltaren  Bayer Aspirin plain, buffered or children's or timed BC Tablets or powders Encaprin Orgaran Warfarin Sodium  Buff-a-Comp Enoxaparin Orudis Zorpin  Buff-a-Comp with Codeine Equegesic Os-Cal-Gesic   Buffaprin Excedrin plain, buffered or Extra Strength Oxalid   Bufferin Arthritis Strength Feldene Oxphenbutazone   Bufferin plain or Extra Strength Feldene Capsules Oxycodone with Aspirin   Bufferin with Codeine Fenoprofen Fenoprofen Pabalate or Pabalate-SF   Buffets II Flogesic Panagesic   Buffinol plain or Extra Strength Florinal or Florinal with Codeine Panwarfarin   Buf-Tabs Flurbiprofen Penicillamine   Butalbital Compound Four-way cold tablets Penicillin   Butazolidin Fragmin Pepto-Bismol   Carbenicillin Geminisyn Percodan   Carna Arthritis Reliever Geopen Persantine   Carprofen Gold's salt Persistin   Chloramphenicol Goody's Phenylbutazone   Chloromycetin Haltrain Piroxlcam   Clmetidine heparin Plaquenil   Cllnoril Hyco-pap Ponstel   Clofibrate Hydroxy chloroquine Propoxyphen         Before stopping any of these medications, be sure to consult the physician who ordered them.  Some, such as Coumadin (Warfarin) are ordered to prevent or treat serious conditions such as "deep thrombosis", "pumonary  embolisms", and other heart problems.  The amount of time that you may need off of the medication may also vary with the medication and the reason for which you were taking it.  If you are taking any of these medications, please make sure you notify your pain physician before you undergo any procedures.

## 2016-02-14 ENCOUNTER — Ambulatory Visit: Payer: Medicare Other | Attending: Pain Medicine | Admitting: Pain Medicine

## 2016-02-14 ENCOUNTER — Encounter: Payer: Self-pay | Admitting: Pain Medicine

## 2016-02-14 VITALS — BP 132/94 | HR 80 | Temp 98.4°F | Resp 15 | Ht 60.0 in | Wt 175.0 lb

## 2016-02-14 DIAGNOSIS — M542 Cervicalgia: Secondary | ICD-10-CM | POA: Insufficient documentation

## 2016-02-14 DIAGNOSIS — M533 Sacrococcygeal disorders, not elsewhere classified: Secondary | ICD-10-CM

## 2016-02-14 DIAGNOSIS — M160 Bilateral primary osteoarthritis of hip: Secondary | ICD-10-CM

## 2016-02-14 DIAGNOSIS — M5481 Occipital neuralgia: Secondary | ICD-10-CM

## 2016-02-14 DIAGNOSIS — Q8500 Neurofibromatosis, unspecified: Secondary | ICD-10-CM

## 2016-02-14 DIAGNOSIS — G43109 Migraine with aura, not intractable, without status migrainosus: Secondary | ICD-10-CM

## 2016-02-14 DIAGNOSIS — M170 Bilateral primary osteoarthritis of knee: Secondary | ICD-10-CM

## 2016-02-14 DIAGNOSIS — R51 Headache: Secondary | ICD-10-CM | POA: Diagnosis present

## 2016-02-14 MED ORDER — BUPIVACAINE HCL (PF) 0.25 % IJ SOLN
INTRAMUSCULAR | Status: AC
  Administered 2016-02-14: 30 mL
  Filled 2016-02-14: qty 30

## 2016-02-14 MED ORDER — TRIAMCINOLONE ACETONIDE 40 MG/ML IJ SUSP
40.0000 mg | Freq: Once | INTRAMUSCULAR | Status: AC
  Administered 2016-02-14: 40 mg

## 2016-02-14 MED ORDER — BUPIVACAINE HCL (PF) 0.25 % IJ SOLN
30.0000 mL | Freq: Once | INTRAMUSCULAR | Status: AC
  Administered 2016-02-14: 30 mL

## 2016-02-14 MED ORDER — SODIUM CHLORIDE 0.9% FLUSH: 20.0000 mL | Freq: Once | INTRAVENOUS | Status: DC

## 2016-02-14 MED ORDER — TRIAMCINOLONE ACETONIDE 40 MG/ML IJ SUSP
INTRAMUSCULAR | Status: AC
  Administered 2016-02-14: 40 mg
  Filled 2016-02-14: qty 1

## 2016-02-14 MED ORDER — SODIUM CHLORIDE 0.9 % IJ SOLN
INTRAMUSCULAR | Status: AC
  Filled 2016-02-14: qty 20

## 2016-02-14 NOTE — Progress Notes (Signed)
   Subjective:    Patient ID: Valerie Mckinney, female    DOB: [birth year], 35 y.o.   MRN: [id number]  HPI  NOTE: The patient is a 35 y.o.-year-old female who returns to the [facility] for further evaluation and treatment of pain consisting of pain involving the region of the neck and headache.  Patient is with History of headaches with concern regarding migraine as well as bilateral occipital neuralgia contributed to patient's headaches significantly. The patient is a pain which radiates from the cervical region for disability region and continues forward to the retro-orbital region. The patient appears to be with significant component of headaches due to bilateral occipital neuralgia .  The risks, benefits, and expectations of the procedure have been discussed and explained to patient, who is understanding and wishes to proceed with interventional treatment as discussed and as explained to patient.  Will proceed with greater occipital nerve blocks with myoneural block injections at this time as discussed and as explained to patient.  All are understanding and in agreement with suggested treatment plan.    PROCEDURE:  Greater occipital nerve block on the left side with EKG, blood pressure, pulse, capnography, and pulse oximetry monitoring.  Procedure performed with patient in the sitting position.  Greater occipital nerve block on the left side.   With patient in prone position, Betadine prep of proposed entry site accomplished.  Following identification of the nuchal ridge, 22 -gauge needle was inserted at the level of the nuchal ridge medial to the occipital artery.  Following negative aspiration, 4cc 0.25% bupivacaine with Kenalog injected for left greater occipital nerve block.  Needle was removed.  Patient tolerated injection well.   Greater occipital nerve block on the rightt side. The greater occipital nerve block on the right side was performed exactly as the left greater occipital  nerve block was performed and utilizing the same technique.   A total of 10 mg Kenalog was utilized for the entire procedure.  PLAN:    1. Medications: Will continue presently prescribed medications at this time. 2. Patient to follow up with primary care physician Dr.M Soles  for evaluation of blood pressure and general medical condition status post procedure performed on today's visit. 3. Neurological evaluation for further assessment of headaches for further studies as discussed. 4. Surgical evaluation as discussed.  5. Patient may be candidate for Botox injections, radiofrequency procedures, as well as implantation type procedures pending response to treatment rendered on today's visit and pending follow-up evaluation. 6. Patient has been advised to adhere to proper body mechanics and to avoid activities which appear to aggravate condition.cations:  Will continue presently prescribed medications at this time. 7. The patient is understanding and in agreement with the suggested treatment plan.   Review of Systems     Objective:   Physical Exam        Assessment & Plan:

## 2016-02-14 NOTE — Patient Instructions (Addendum)
PLAN   Continue present medication  F/U PCP Dr.Meridth Soles  for evaliation of  BP and general medical  condition  F/U surgical evaluation.  Neurosurgical evaluation as we previously discussed  Ask receptionist date of your appointment at [facility] May schedule appointment at [facility] pain clinic or [facility] pain clinic if more convenient   F/U neurological evaluation. Follow-up Dr. Melrose Nakayama as discussed . Please mention Neurontin to Dr. Melrose Nakayama at time of your reevaluation as we previously discussed  May consider radiofrequency rhizolysis or intraspinal procedures pending response to present treatment and F/U evaluation . We  will avoid these procedures at this time  Patient to call [facility] should patient have concerns prior to scheduled return appointment.GENERAL RISKS AND COMPLICATIONS  What are the risk, side effects and possible complications? Generally speaking, most procedures are safe.  However, with any procedure there are risks, side effects, and the possibility of complications.  The risks and complications are dependent upon the sites that are lesioned, or the type of nerve block to be performed.  The closer the procedure is to the spine, the more serious the risks are.  Great care is taken when placing the radio frequency needles, block needles or lesioning probes, but sometimes complications can occur. 1. Infection: Any time there is an injection through the skin, there is a risk of infection.  This is why sterile conditions are used for these blocks.  There are four possible types of infection. 1. Localized skin infection. 2. Central Nervous System Infection-This can be in the form of Meningitis, which can be deadly. 3. Epidural Infections-This can be in the form of an epidural abscess, which can cause pressure inside of the spine, causing compression of the spinal cord with subsequent paralysis. This would require an emergency surgery to decompress, and there  are no guarantees that the patient would recover from the paralysis. 4. Discitis-This is an infection of the intervertebral discs.  It occurs in about 1% of discography procedures.  It is difficult to treat and it may lead to surgery.        2. Pain: the needles have to go through skin and soft tissues, will cause soreness.       3. Damage to internal structures:  The nerves to be lesioned may be near blood vessels or    other nerves which can be potentially damaged.       4. Bleeding: Bleeding is more common if the patient is taking blood thinners such as  aspirin, Coumadin, Ticiid, Plavix, etc., or if he/she have some genetic predisposition  such as hemophilia. Bleeding into the spinal canal can cause compression of the spinal  cord with subsequent paralysis.  This would require an emergency surgery to  decompress and there are no guarantees that the patient would recover from the  paralysis.       5. Pneumothorax:  Puncturing of a lung is a possibility, every time a needle is introduced in  the area of the chest or upper back.  Pneumothorax refers to free air around the  collapsed lung(s), inside of the thoracic cavity (chest cavity).  Another two possible  complications related to a similar event would include: Hemothorax and Chylothorax.   These are variations of the Pneumothorax, where instead of air around the collapsed  lung(s), you may have blood or chyle, respectively.       6. Spinal headaches: They may occur with any procedures in the area of the spine.  7. Persistent CSF (Cerebro-Spinal Fluid) leakage: This is a rare problem, but may occur  with prolonged intrathecal or epidural catheters either due to the formation of a fistulous  track or a dural tear.       8. Nerve damage: By working so close to the spinal cord, there is always a possibility of  nerve damage, which could be as serious as a permanent spinal cord injury with  paralysis.       9. Death:  Although rare, severe deadly  allergic reactions known as "Anaphylactic  reaction" can occur to any of the medications used.      10. Worsening of the symptoms:  We can always make thing worse.  What are the chances of something like this happening? Chances of any of this occuring are extremely low.  By statistics, you have more of a chance of getting killed in a motor vehicle accident: while driving to the hospital than any of the above occurring .  Nevertheless, you should be aware that they are possibilities.  In general, it is similar to taking a shower.  Everybody knows that you can slip, hit your head and get killed.  Does that mean that you should not shower again?  Nevertheless always keep in mind that statistics do not mean anything if you happen to be on the wrong side of them.  Even if a procedure has a 1 (one) in a 1,000,000 (million) chance of going wrong, it you happen to be that one..Also, keep in mind that by statistics, you have more of a chance of having something go wrong when taking medications.  Who should not have this procedure? If you are on a blood thinning medication (e.g. Coumadin, Plavix, see list of "Blood Thinners"), or if you have an active infection going on, you should not have the procedure.  If you are taking any blood thinners, please inform your physician.  How should I prepare for this procedure?  Do not eat or drink anything at least six hours prior to the procedure.  Bring a driver with you .  It cannot be a taxi.  Come accompanied by an adult that can drive you back, and that is strong enough to help you if your legs get weak or numb from the local anesthetic.  Take all of your medicines the morning of the procedure with just enough water to swallow them.  If you have diabetes, make sure that you are scheduled to have your procedure done first thing in the morning, whenever possible.  If you have diabetes, take only half of your insulin dose and notify our nurse that you have done so  as soon as you arrive at the clinic.  If you are diabetic, but only take blood sugar pills (oral hypoglycemic), then do not take them on the morning of your procedure.  You may take them after you have had the procedure.  Do not take aspirin or any aspirin-containing medications, at least eleven (11) days prior to the procedure.  They may prolong bleeding.  Wear loose fitting clothing that may be easy to take off and that you would not mind if it got stained with Betadine or blood.  Do not wear any jewelry or perfume  Remove any nail coloring.  It will interfere with some of our monitoring equipment.  NOTE: Remember that this is not meant to be interpreted as a complete list of all possible complications.  Unforeseen problems may occur.  BLOOD THINNERS  The following drugs contain aspirin or other products, which can cause increased bleeding during surgery and should not be taken for 2 weeks prior to and 1 week after surgery.  If you should need take something for relief of minor pain, you may take acetaminophen which is found in Tylenol,m Datril, Anacin-3 and Panadol. It is not blood thinner. The products listed below are.  Do not take any of the products listed below in addition to any listed on your instruction sheet.  A.P.C or A.P.C with Codeine Codeine Phosphate Capsules #3 Ibuprofen Ridaura  ABC compound Congesprin Imuran rimadil  Advil Cope Indocin Robaxisal  Alka-Seltzer Effervescent Pain Reliever and Antacid Coricidin or Coricidin-D  Indomethacin Rufen  Alka-Seltzer plus Cold Medicine Cosprin Ketoprofen S-A-C Tablets  Anacin Analgesic Tablets or Capsules Coumadin Korlgesic Salflex  Anacin Extra Strength Analgesic tablets or capsules CP-2 Tablets Lanoril Salicylate  Anaprox Cuprimine Capsules Levenox Salocol  Anexsia-D Dalteparin Magan Salsalate  Anodynos Darvon compound Magnesium Salicylate Sine-off  Ansaid Dasin Capsules Magsal Sodium Salicylate  Anturane Depen Capsules Marnal  Soma  APF Arthritis pain formula Dewitt's Pills Measurin Stanback  Argesic Dia-Gesic Meclofenamic Sulfinpyrazone  Arthritis Bayer Timed Release Aspirin Diclofenac Meclomen Sulindac  Arthritis pain formula Anacin Dicumarol Medipren Supac  Analgesic (Safety coated) Arthralgen Diffunasal Mefanamic Suprofen  Arthritis Strength Bufferin Dihydrocodeine Mepro Compound Suprol  Arthropan liquid Dopirydamole Methcarbomol with Aspirin Synalgos  ASA tablets/Enseals Disalcid Micrainin Tagament  Ascriptin Doan's Midol Talwin  Ascriptin A/D Dolene Mobidin Tanderil  Ascriptin Extra Strength Dolobid Moblgesic Ticlid  Ascriptin with Codeine Doloprin or Doloprin with Codeine Momentum Tolectin  Asperbuf Duoprin Mono-gesic Trendar  Aspergum Duradyne Motrin or Motrin IB Triminicin  Aspirin plain, buffered or enteric coated Durasal Myochrisine Trigesic  Aspirin Suppositories Easprin Nalfon Trillsate  Aspirin with Codeine Ecotrin Regular or Extra Strength Naprosyn Uracel  Atromid-S Efficin Naproxen Ursinus  Auranofin Capsules Elmiron Neocylate Vanquish  Axotal Emagrin Norgesic Verin  Azathioprine Empirin or Empirin with Codeine Normiflo Vitamin E  Azolid Emprazil Nuprin Voltaren  Bayer Aspirin plain, buffered or children's or timed BC Tablets or powders Encaprin Orgaran Warfarin Sodium  Buff-a-Comp Enoxaparin Orudis Zorpin  Buff-a-Comp with Codeine Equegesic Os-Cal-Gesic   Buffaprin Excedrin plain, buffered or Extra Strength Oxalid   Bufferin Arthritis Strength Feldene Oxphenbutazone   Bufferin plain or Extra Strength Feldene Capsules Oxycodone with Aspirin   Bufferin with Codeine Fenoprofen Fenoprofen Pabalate or Pabalate-SF   Buffets II Flogesic Panagesic   Buffinol plain or Extra Strength Florinal or Florinal with Codeine Panwarfarin   Buf-Tabs Flurbiprofen Penicillamine   Butalbital Compound Four-way cold tablets Penicillin   Butazolidin Fragmin Pepto-Bismol   Carbenicillin Geminisyn Percodan   Carna  Arthritis Reliever Geopen Persantine   Carprofen Gold's salt Persistin   Chloramphenicol Goody's Phenylbutazone   Chloromycetin Haltrain Piroxlcam   Clmetidine heparin Plaquenil   Cllnoril Hyco-pap Ponstel   Clofibrate Hydroxy chloroquine Propoxyphen         Before stopping any of these medications, be sure to consult the physician who ordered them.  Some, such as Coumadin (Warfarin) are ordered to prevent or treat serious conditions such as "deep thrombosis", "pumonary embolisms", and other heart problems.  The amount of time that you may need off of the medication may also vary with the medication and the reason for which you were taking it.  If you are taking any of these medications, please make sure you notify your pain physician before you undergo any procedures.

## 2016-02-14 NOTE — Progress Notes (Signed)
Safety precautions to be maintained throughout the outpatient stay will include: orient to surroundings, keep bed in low position, maintain call bell within reach at all times, provide assistance with transfer out of bed and ambulation.  

## 2016-02-15 ENCOUNTER — Telehealth: Payer: Self-pay

## 2016-02-15 NOTE — Telephone Encounter (Signed)
Post procedure phone call.  Patient states she is doing fine.  

## 2016-03-07 ENCOUNTER — Encounter: Payer: Self-pay | Admitting: Pain Medicine

## 2016-03-07 ENCOUNTER — Ambulatory Visit: Payer: Medicare Other | Attending: Pain Medicine | Admitting: Pain Medicine

## 2016-03-07 VITALS — BP 120/71 | HR 76 | Resp 16 | Ht 65.0 in | Wt 175.0 lb

## 2016-03-07 DIAGNOSIS — Q8500 Neurofibromatosis, unspecified: Secondary | ICD-10-CM | POA: Insufficient documentation

## 2016-03-07 DIAGNOSIS — M5030 Other cervical disc degeneration, unspecified cervical region: Secondary | ICD-10-CM | POA: Insufficient documentation

## 2016-03-07 DIAGNOSIS — M533 Sacrococcygeal disorders, not elsewhere classified: Secondary | ICD-10-CM | POA: Insufficient documentation

## 2016-03-07 DIAGNOSIS — M170 Bilateral primary osteoarthritis of knee: Secondary | ICD-10-CM

## 2016-03-07 DIAGNOSIS — G40909 Epilepsy, unspecified, not intractable, without status epilepticus: Secondary | ICD-10-CM | POA: Insufficient documentation

## 2016-03-07 DIAGNOSIS — M5481 Occipital neuralgia: Secondary | ICD-10-CM | POA: Insufficient documentation

## 2016-03-07 DIAGNOSIS — G43109 Migraine with aura, not intractable, without status migrainosus: Secondary | ICD-10-CM

## 2016-03-07 DIAGNOSIS — R51 Headache: Secondary | ICD-10-CM | POA: Diagnosis present

## 2016-03-07 DIAGNOSIS — G43909 Migraine, unspecified, not intractable, without status migrainosus: Secondary | ICD-10-CM | POA: Diagnosis not present

## 2016-03-07 DIAGNOSIS — M50221 Other cervical disc displacement at C4-C5 level: Secondary | ICD-10-CM | POA: Diagnosis not present

## 2016-03-07 DIAGNOSIS — M160 Bilateral primary osteoarthritis of hip: Secondary | ICD-10-CM

## 2016-03-07 DIAGNOSIS — M5136 Other intervertebral disc degeneration, lumbar region: Secondary | ICD-10-CM | POA: Insufficient documentation

## 2016-03-07 NOTE — Progress Notes (Signed)
Safety precautions to be maintained throughout the outpatient stay will include: orient to surroundings, keep bed in low position, maintain call bell within reach at all times, provide assistance with transfer out of bed and ambulation.  

## 2016-03-07 NOTE — Progress Notes (Signed)
Subjective:    Patient ID: Kendal Hymen, female    DOB: May 20, 1981, 35 y.o.   MRN: [id number]  HPI  The patient is a 35 year old female who returns to pain management for further evaluation and treatment of pain involving headaches with pain radiating from the neck to the back of the hip precipitating headaches with headache felt to be due to bilateral occipital neuralgia as well as component of migraine. The patient has had significant improvement of her headaches with occipital nerve blocks. The patient also is with diagnosis of neurofibromatosis. The patient has pain involving the lumbar lower extremity region and is undergone evaluation at [facility] and will follow-up with physician at [facility] as planned. At the present time patient wished to proceed with bilateral occipital nerve blocks which have been most beneficial in terms of reducing the frequency and intensity of her headaches. We will consider patient for additional modifications of treatment pending response to treatment and follow-up evaluation and we will proceed with bilateral occipital nerve blocks to be performed at time of return appointment as discussed and as explained to patient on today's visit and agreed to suggested treatment plan.  Review of Systems     Objective:   Physical Exam  There was tends to palpation of the paraspinal muscular treat the cervical region cervical facet region palpation which reproduces moderate discomfort with moderately severe tenderness of the splenius capitis and occipitalis musculature regions. There were no masses of the hip negative noted and no excessive tends to palpation of the sinuses noted. No bounding pulsations of the temporal region noted. There was minimal tenderness of the temporomandibular joint region noted. Palpation over the region of the thoracic facet thoracic paraspinal musculature region was without tenderness to palpation of significant  degree and patient was with moderate tenderness to palpation over the lumbar paraspinal misreading lumbar facet region. There was increased pain with lateral bending rotation extension and palpation of the lumbar facets of moderate degree with moderate tenderness of the thoracic region and thoracic paraspinal musculature region and thoracic facet region is well. Palpation of the PSIS and PII S region was associated with moderate discomfort with mild tenderness of the greater trochanteric region iliotibial band region. No definite sensory deficit or dermatomal dystrophy detected. There was negative clonus negative Homans. Abdomen nontender with no costovertebral tenderness noted.          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 No lumbar disc protrusions, spinal or foraminal stenosis  Neurofibromatosis  Seizure disorder      PLAN   Continue present medications  Greater occipital nerve block to be performed at time of return appointment  F/U PCP Dr.Meridth Soles  for evaliation of  BP and general medical  condition  F/U surgical evaluation. Ask receptionist the date of your neurosurgical evaluation as we previously discussed  F/U [place] as discussed  F/U neurological evaluation. Follow-up Dr. Melrose Nakayama as discussed .   May consider radiofrequency rhizolysis or intraspinal procedures pending response to present treatment and F/U evaluation . We  will avoid these procedures at this time  Patient to call [facility] Management Center should patient have  concerns prior to scheduled return appointment.

## 2016-03-07 NOTE — Patient Instructions (Addendum)
PLAN   Continue present medications  Greater occipital nerve block to be performed at time of return appointment  F/U PCP Dr.Meridth Soles  for evaliation of  BP and general medical  condition  F/U surgical evaluation. Ask receptionist the date of your neurosurgical evaluation as we previously discussed  F/U [place] as discussed  F/U neurological evaluation. Follow-up Dr. Melrose Nakayama as discussed .   May consider radiofrequency rhizolysis or intraspinal procedures pending response to present treatment and F/U evaluation . We  will avoid these procedures at this time  Patient to call [facility] Center should patient have concerns prior to scheduled return appointment.GENERAL RISKS AND COMPLICATIONS  What are the risk, side effects and possible complications? Generally speaking, most procedures are safe.  However, with any procedure there are risks, side effects, and the possibility of complications.  The risks and complications are dependent upon the sites that are lesioned, or the type of nerve block to be performed.  The closer the procedure is to the spine, the more serious the risks are.  Great care is taken when placing the radio frequency needles, block needles or lesioning probes, but sometimes complications can occur. 1. Infection: Any time there is an injection through the skin, there is a risk of infection.  This is why sterile conditions are used for these blocks.  There are four possible types of infection. 1. Localized skin infection. 2. Central Nervous System Infection-This can be in the form of Meningitis, which can be deadly. 3. Epidural Infections-This can be in the form of an epidural abscess, which can cause pressure inside of the spine, causing compression of the spinal cord with subsequent paralysis. This would require an emergency surgery to decompress, and there are no guarantees that the patient would recover from the paralysis. 4. Discitis-This is an  infection of the intervertebral discs.  It occurs in about 1% of discography procedures.  It is difficult to treat and it may lead to surgery.        2. Pain: the needles have to go through skin and soft tissues, will cause soreness.       3. Damage to internal structures:  The nerves to be lesioned may be near blood vessels or    other nerves which can be potentially damaged.       4. Bleeding: Bleeding is more common if the patient is taking blood thinners such as  aspirin, Coumadin, Ticiid, Plavix, etc., or if he/she have some genetic predisposition  such as hemophilia. Bleeding into the spinal canal can cause compression of the spinal  cord with subsequent paralysis.  This would require an emergency surgery to  decompress and there are no guarantees that the patient would recover from the  paralysis.       5. Pneumothorax:  Puncturing of a lung is a possibility, every time a needle is introduced in  the area of the chest or upper back.  Pneumothorax refers to free air around the  collapsed lung(s), inside of the thoracic cavity (chest cavity).  Another two possible  complications related to a similar event would include: Hemothorax and Chylothorax.   These are variations of the Pneumothorax, where instead of air around the collapsed  lung(s), you may have blood or chyle, respectively.       6. Spinal headaches: They may occur with any procedures in the area of the spine.       7. Persistent CSF (Cerebro-Spinal Fluid) leakage: This is a rare  problem, but may occur  with prolonged intrathecal or epidural catheters either due to the formation of a fistulous  track or a dural tear.       8. Nerve damage: By working so close to the spinal cord, there is always a possibility of  nerve damage, which could be as serious as a permanent spinal cord injury with  paralysis.       9. Death:  Although rare, severe deadly allergic reactions known as "Anaphylactic  reaction" can occur to any of the medications used.       10. Worsening of the symptoms:  We can always make thing worse.  What are the chances of something like this happening? Chances of any of this occuring are extremely low.  By statistics, you have more of a chance of getting killed in a motor vehicle accident: while driving to the hospital than any of the above occurring .  Nevertheless, you should be aware that they are possibilities.  In general, it is similar to taking a shower.  Everybody knows that you can slip, hit your head and get killed.  Does that mean that you should not shower again?  Nevertheless always keep in mind that statistics do not mean anything if you happen to be on the wrong side of them.  Even if a procedure has a 1 (one) in a 1,000,000 (million) chance of going wrong, it you happen to be that one..Also, keep in mind that by statistics, you have more of a chance of having something go wrong when taking medications.  Who should not have this procedure? If you are on a blood thinning medication (e.g. Coumadin, Plavix, see list of "Blood Thinners"), or if you have an active infection going on, you should not have the procedure.  If you are taking any blood thinners, please inform your physician.  How should I prepare for this procedure?  Do not eat or drink anything at least six hours prior to the procedure.  Bring a driver with you .  It cannot be a taxi.  Come accompanied by an adult that can drive you back, and that is strong enough to help you if your legs get weak or numb from the local anesthetic.  Take all of your medicines the morning of the procedure with just enough water to swallow them.  If you have diabetes, make sure that you are scheduled to have your procedure done first thing in the morning, whenever possible.  If you have diabetes, take only half of your insulin dose and notify our nurse that you have done so as soon as you arrive at the clinic.  If you are diabetic, but only take blood sugar pills  (oral hypoglycemic), then do not take them on the morning of your procedure.  You may take them after you have had the procedure.  Do not take aspirin or any aspirin-containing medications, at least eleven (11) days prior to the procedure.  They may prolong bleeding.  Wear loose fitting clothing that may be easy to take off and that you would not mind if it got stained with Betadine or blood.  Do not wear any jewelry or perfume  Remove any nail coloring.  It will interfere with some of our monitoring equipment.  NOTE: Remember that this is not meant to be interpreted as a complete list of all possible complications.  Unforeseen problems may occur.  BLOOD THINNERS The following drugs contain aspirin or other products, which can  cause increased bleeding during surgery and should not be taken for 2 weeks prior to and 1 week after surgery.  If you should need take something for relief of minor pain, you may take acetaminophen which is found in Tylenol,m Datril, Anacin-3 and Panadol. It is not blood thinner. The products listed below are.  Do not take any of the products listed below in addition to any listed on your instruction sheet.  A.P.C or A.P.C with Codeine Codeine Phosphate Capsules #3 Ibuprofen Ridaura  ABC compound Congesprin Imuran rimadil  Advil Cope Indocin Robaxisal  Alka-Seltzer Effervescent Pain Reliever and Antacid Coricidin or Coricidin-D  Indomethacin Rufen  Alka-Seltzer plus Cold Medicine Cosprin Ketoprofen S-A-C Tablets  Anacin Analgesic Tablets or Capsules Coumadin Korlgesic Salflex  Anacin Extra Strength Analgesic tablets or capsules CP-2 Tablets Lanoril Salicylate  Anaprox Cuprimine Capsules Levenox Salocol  Anexsia-D Dalteparin Magan Salsalate  Anodynos Darvon compound Magnesium Salicylate Sine-off  Ansaid Dasin Capsules Magsal Sodium Salicylate  Anturane Depen Capsules Marnal Soma  APF Arthritis pain formula Dewitt's Pills Measurin Stanback  Argesic Dia-Gesic  Meclofenamic Sulfinpyrazone  Arthritis Bayer Timed Release Aspirin Diclofenac Meclomen Sulindac  Arthritis pain formula Anacin Dicumarol Medipren Supac  Analgesic (Safety coated) Arthralgen Diffunasal Mefanamic Suprofen  Arthritis Strength Bufferin Dihydrocodeine Mepro Compound Suprol  Arthropan liquid Dopirydamole Methcarbomol with Aspirin Synalgos  ASA tablets/Enseals Disalcid Micrainin Tagament  Ascriptin Doan's Midol Talwin  Ascriptin A/D Dolene Mobidin Tanderil  Ascriptin Extra Strength Dolobid Moblgesic Ticlid  Ascriptin with Codeine Doloprin or Doloprin with Codeine Momentum Tolectin  Asperbuf Duoprin Mono-gesic Trendar  Aspergum Duradyne Motrin or Motrin IB Triminicin  Aspirin plain, buffered or enteric coated Durasal Myochrisine Trigesic  Aspirin Suppositories Easprin Nalfon Trillsate  Aspirin with Codeine Ecotrin Regular or Extra Strength Naprosyn Uracel  Atromid-S Efficin Naproxen Ursinus  Auranofin Capsules Elmiron Neocylate Vanquish  Axotal Emagrin Norgesic Verin  Azathioprine Empirin or Empirin with Codeine Normiflo Vitamin E  Azolid Emprazil Nuprin Voltaren  Bayer Aspirin plain, buffered or children's or timed BC Tablets or powders Encaprin Orgaran Warfarin Sodium  Buff-a-Comp Enoxaparin Orudis Zorpin  Buff-a-Comp with Codeine Equegesic Os-Cal-Gesic   Buffaprin Excedrin plain, buffered or Extra Strength Oxalid   Bufferin Arthritis Strength Feldene Oxphenbutazone   Bufferin plain or Extra Strength Feldene Capsules Oxycodone with Aspirin   Bufferin with Codeine Fenoprofen Fenoprofen Pabalate or Pabalate-SF   Buffets II Flogesic Panagesic   Buffinol plain or Extra Strength Florinal or Florinal with Codeine Panwarfarin   Buf-Tabs Flurbiprofen Penicillamine   Butalbital Compound Four-way cold tablets Penicillin   Butazolidin Fragmin Pepto-Bismol   Carbenicillin Geminisyn Percodan   Carna Arthritis Reliever Geopen Persantine   Carprofen Gold's salt Persistin    Chloramphenicol Goody's Phenylbutazone   Chloromycetin Haltrain Piroxlcam   Clmetidine heparin Plaquenil   Cllnoril Hyco-pap Ponstel   Clofibrate Hydroxy chloroquine Propoxyphen         Before stopping any of these medications, be sure to consult the physician who ordered them.  Some, such as Coumadin (Warfarin) are ordered to prevent or treat serious conditions such as "deep thrombosis", "pumonary embolisms", and other heart problems.  The amount of time that you may need off of the medication may also vary with the medication and the reason for which you were taking it.  If you are taking any of these medications, please make sure you notify your pain physician before you undergo any procedures.         Occipital Nerve Block Patient Information  Description: The occipital nerves originate in the  cervical (neck) spinal cord and travel upward through muscle and tissue to supply sensation to the back of the head and top of the scalp.  In addition, the nerves control some of the muscles of the scalp.  Occipital neuralgia is an irritation of these nerves which can cause headaches, numbness of the scalp, and neck discomfort.     The occipital nerve block will interrupt nerve transmission through these nerves and can relieve pain and spasm.  The block consists of insertion of a small needle under the skin in the back of the head to deposit local anesthetic (numbing medicine) and/or steroids around the nerve.  The entire block usually lasts less than 5 minutes.  Conditions which may be treated by occipital blocks:   Muscular pain and spasm of the scalp  Nerve irritation, back of the head  Headaches  Upper neck pain  Preparation for the injection:  12. Do not eat any solid food or dairy products within 8 hours of your appointment. 13. You may drink clear liquids up to 3 hours before appointment.  Clear liquids include water, black coffee, juice or soda.  No milk or cream  please. 14. You may take your regular medication, including pain medications, with a sip of water before you appointment.  Diabetics should hold regular insulin (if taken separately) and take 1/2 normal NPH dose the morning of the procedure.  Carry some sugar containing items with you to your appointment. 15. A driver must accompany you and be prepared to drive you home after your procedure. 3. Bring all your current medications with you. 17. An IV may be inserted and sedation may be given at the discretion of the physician. 18. A blood pressure cuff, EKG, and other monitors will often be applied during the procedure.  Some patients may need to have extra oxygen administered for a short period. 60. You will be asked to provide medical information, including your allergies and medications, prior to the procedure.  We must know immediately if you are taking blood thinners (like Coumadin/Warfarin) or if you are allergic to IV iodine contrast (dye).  We must know if you could possible be pregnant.  20. Do not wear a high collared shirt or turtleneck.  Tie long hair up in the back if possible.  Possible side-effects:   Bleeding from needle site  Infection (rare, may require surgery)  Nerve injury (rare)  Hair on back of neck can be tinged with iodine scrub (this will wash out)  Light-headedness (temporary)  Pain at injection site (several days)  Decreased blood pressure (rare, temporary)  Seizure (very rare)  Call if you experience:   Hives or difficulty breathing ( go to the emergency room)  Inflammation or drainage at the injection site(s)  Please note:  Although the local anesthetic injected can often make your painful muscles or headache feel good for several hours after the injection, the pain may return.  It takes 3-7 days for steroids to work.  You may not notice any pain relief for at least one week.  If effective, we will often do a series of injections spaced 3-6 weeks  apart to maximally decrease your pain.  If you have any questions, please call [phone] [facility]

## 2016-03-20 ENCOUNTER — Encounter: Payer: Self-pay | Admitting: Pain Medicine

## 2016-03-20 ENCOUNTER — Ambulatory Visit: Payer: Medicare Other | Attending: Pain Medicine | Admitting: Pain Medicine

## 2016-03-20 VITALS — BP 138/84 | HR 68 | Temp 98.0°F | Resp 16 | Ht 65.0 in | Wt 175.0 lb

## 2016-03-20 DIAGNOSIS — R51 Headache: Secondary | ICD-10-CM | POA: Diagnosis present

## 2016-03-20 DIAGNOSIS — M542 Cervicalgia: Secondary | ICD-10-CM | POA: Diagnosis present

## 2016-03-20 DIAGNOSIS — G43109 Migraine with aura, not intractable, without status migrainosus: Secondary | ICD-10-CM

## 2016-03-20 DIAGNOSIS — M5481 Occipital neuralgia: Secondary | ICD-10-CM

## 2016-03-20 DIAGNOSIS — M160 Bilateral primary osteoarthritis of hip: Secondary | ICD-10-CM

## 2016-03-20 DIAGNOSIS — M170 Bilateral primary osteoarthritis of knee: Secondary | ICD-10-CM

## 2016-03-20 DIAGNOSIS — Q8500 Neurofibromatosis, unspecified: Secondary | ICD-10-CM

## 2016-03-20 DIAGNOSIS — M533 Sacrococcygeal disorders, not elsewhere classified: Secondary | ICD-10-CM

## 2016-03-20 MED ORDER — SODIUM CHLORIDE 0.9% FLUSH: 20.0000 mL | Freq: Once | INTRAVENOUS | Status: AC

## 2016-03-20 MED ORDER — LACTATED RINGERS IV SOLN: 1000.0000 mL | INTRAVENOUS | Status: AC

## 2016-03-20 MED ORDER — BUPIVACAINE HCL (PF) 0.25 % IJ SOLN
30.0000 mL | Freq: Once | INTRAMUSCULAR | Status: AC
  Administered 2016-03-20: 30 mL
  Filled 2016-03-20: qty 30

## 2016-03-20 MED ORDER — TRIAMCINOLONE ACETONIDE 40 MG/ML IJ SUSP
40.0000 mg | Freq: Once | INTRAMUSCULAR | Status: AC
  Administered 2016-03-20: 40 mg
  Filled 2016-03-20: qty 1

## 2016-03-20 MED ORDER — FENTANYL CITRATE (PF) 100 MCG/2ML IJ SOLN
100.0000 ug | Freq: Once | INTRAMUSCULAR | Status: DC
  Filled 2016-03-20: qty 2

## 2016-03-20 MED ORDER — MIDAZOLAM HCL 5 MG/5ML IJ SOLN
5.0000 mg | Freq: Once | INTRAMUSCULAR | Status: AC
  Filled 2016-03-20: qty 5

## 2016-03-20 NOTE — Progress Notes (Signed)
Safety precautions to be maintained throughout the outpatient stay will include: orient to surroundings, keep bed in low position, maintain call bell within reach at all times, provide assistance with transfer out of bed and ambulation.  

## 2016-03-20 NOTE — Patient Instructions (Addendum)
PLAN   Continue present medications  F/U PCP Dr.Meridth Soles  for evaliation of  BP and general medical  condition  F/U surgical evaluation. Ask receptionist the date of your neurosurgical evaluation as we previously discussed  F/U [place] as discussed  F/U neurological evaluation. Follow-up Dr. Melrose Nakayama as discussed .   May consider radiofrequency rhizolysis or intraspinal procedures pending response to present treatment and F/U evaluation . We  will avoid these procedures at this time  Patient to call Pain Management Center should patient have concerns prior to scheduled return appointment.Selective Nerve Root Block Patient Information  Description: Specific nerve roots exit the spinal canal and these nerves can be compressed and inflamed by a bulging disc and bone spurs.  By injecting steroids on the nerve root, we can potentially decrease the inflammation surrounding these nerves, which often leads to decreased pain.  Also, by injecting local anesthesia on the nerve root, this can provide [place] helpful information to give to your referring doctor if it decreases your pain.  Selective nerve root blocks can be done along the spine from the neck to the low back depending on the location of your pain.   After numbing the skin with local anesthesia, a small needle is passed to the nerve root and the position of the needle is verified using x-ray pictures.  After the needle is in correct position, we then deposit the medication.  You may experience a pressure sensation while this is being done.  The entire block usually lasts less than 15 minutes.  Conditions that may be treated with selective nerve root blocks:  Low back and leg pain  Spinal stenosis  Diagnostic block prior to potential surgery  Neck and arm pain  Post laminectomy syndrome  Preparation for the injection:  1. Do not eat any solid food or dairy products within 8 hours of your appointment. 2. You may drink  clear liquids up to 3 hours before an appointment.  Clear liquids include water, black coffee, juice or soda.  No milk or cream please. 3. You may take your regular medications, including pain medications, with a sip of water before your appointment.  Diabetics should hold regular insulin (if taken separately) and take 1/2 normal NPH dose the morning of the procedure.  Carry some sugar containing items with you to your appointment. 4. A driver must accompany you and be prepared to drive you home after your procedure. 5. Bring all your current medications with you. 6. An IV may be inserted and sedation may be given at the discretion of the physician. 7. A blood pressure cuff, EKG, and other monitors will often be applied during the procedure.  Some patients may need to have extra oxygen administered for a short period. 8. You will be asked to provide medical information, including allergies, prior to the procedure.  We must know immediately if you are taking blood  Thinners (like Coumadin) or if you are allergic to IV iodine contrast (dye).  Possible side-effects: All are usually temporary  Bleeding from needle site  Light headedness  Numbness and tingling  Decreased blood pressure  Weakness in arms/legs  Pressure sensation in back/neck  Pain at injection site (several days)  Possible complications: All are extremely rare  Infection  Nerve injury  Spinal headache (a headache wore with upright position)  Call if you experience:  Fever/chills associated with headache or increased back/neck pain  Headache worsened by an upright position  New onset weakness or numbness  of an extremity below the injection site  Hives or difficulty breathing (go to the emergency room)  Inflammation or drainage at the injection site(s)  Severe back/neck pain greater than usual  New symptoms which are concerning to you  Please note:  Although the local anesthetic injected can often make  your back or neck feel good for several hours after the injection the pain will likely return.  It takes 3-5 days for steroids to work on the nerve root. You may not notice any pain relief for at least one week.  If effective, we will often do a series of 3 injections spaced 3-6 weeks apart to maximally decrease your pain.    If you have any questions, please call [phone] [facility]  General Discharge Instructions :  If you need to reach your doctor call: Monday-Friday 8:00 am - 4:00 pm at [phone] or toll free [phone].  After clinic hours [phone] to have operator reach doctor.  Bring all of your medication bottles to all your appointments in the pain clinic.  To cancel or reschedule your appointment with Pain Management please remember to call 24 hours in advance to avoid a fee.  Refer to the educational materials which you have been given on: General Risks, I had my Procedure. Discharge Instructions, Post Sedation.  Post Procedure Instructions:  The drugs you were given will stay in your system until tomorrow, so for the next 24 hours you should not drive, make any legal decisions or drink any alcoholic beverages.  You may eat anything you prefer, but it is better to start with liquids then soups and crackers, and gradually work up to solid foods.  Please notify your doctor immediately if you have any unusual bleeding, trouble breathing or pain that is not related to your normal pain.  Depending on the type of procedure that was done, some parts of your body may feel week and/or numb.  This usually clears up by tonight or the next day.  Walk with the use of an assistive device or accompanied by an adult for the 24 hours.  You may use ice on the affected area for the first 24 hours.  Put ice in a Ziploc bag and cover with a towel and place against area 15 minutes on 15 minutes off.  You may  switch to heat after 24 hours.

## 2016-03-20 NOTE — Progress Notes (Signed)
   Subjective:    Patient ID: Valerie Mckinney, female    DOB: December 28, 1980, 35 y.o.   MRN: [id number]  HPI                                     BILATERAL OCCIPITAL NERVE BLOCKS  NOTE: The patient is a 35 y.o.-year-old female who returns to the Pain Management Center for further evaluation and treatment of pain consisting of pain involving the region of the neck and headache.  Patient is with Headaches with pain beginning in the region of the neck radiating to the back of the head and continuing forward to the retro-orbital region. There is concern regarding a significant component of patient's pain being due to bilateral occipital neuralgia .  The risks, benefits, and expectations of the procedure have been discussed and explained to patient, who is understanding and wishes to proceed with interventional treatment as discussed and as explained to patient.  Will proceed with greater occipital nerve blocks with myoneural block injections at this time as discussed and as explained to patient.  All are understanding and in agreement with suggested treatment plan.    PROCEDURE:  Greater occipital nerve block on the left side with IV Versed, IV Fentanyl, conscious sedation, EKG, blood pressure, pulse, capnography, and pulse oximetry monitoring.  Procedure performed with patient in prone position.  Greater occipital nerve block on the left side.   With patient in prone position, Betadine prep of proposed entry site accomplished.  Following identification of the nuchal ridge, 22 -gauge needle was inserted at the level of the nuchal ridge medial to the occipital artery.  Following negative aspiration, 4cc 0.25% bupivacaine with Kenalog injected for left greater occipital nerve block.  Needle was removed.  Patient tolerated injection well.   Greater occipital nerve block on the rightt side. The greater occipital nerve block on the right side was performed exactly as the left greater occipital nerve block was  performed and utilizing the same technique.   A total of 10 mg Kenalog was utilized for the entire procedure.  PLAN:    1. Medications: Will continue presently prescribed medications at this time. 2. Patient to follow up with primary care physician Dr. Caryn Bee for evaluation of blood pressure and general medical condition status post procedure performed on today's visit. 3. Neurological evaluation for further assessment of headaches and for other studies as discussed. Appointment at [facility] as planned  4. Surgical evaluation as discussed.  5. Patient may be candidate for Botox injections, radiofrequency procedures, as well as implantation type procedures pending response to treatment rendered on today's visit and pending follow-up evaluation. 6. Patient has been advised to adhere to proper body mechanics and to avoid activities which appear to aggravate condition.cations:  Will continue presently prescribed medications at this time. 7. The patient is understanding and in agreement with the suggested treatment plan.    Review of Systems     Objective:   Physical Exam        Assessment & Plan:

## 2016-03-21 ENCOUNTER — Telehealth: Payer: Self-pay | Admitting: *Deleted

## 2016-03-21 NOTE — Telephone Encounter (Signed)
Spoke with patient re; procedure on yesterday, verbalies no questions or concerns.

## 2016-04-10 ENCOUNTER — Encounter: Payer: Self-pay | Admitting: Pain Medicine

## 2016-04-10 ENCOUNTER — Ambulatory Visit: Payer: Medicare Other | Attending: Pain Medicine | Admitting: Pain Medicine

## 2016-04-10 VITALS — BP 118/74 | HR 73 | Temp 98.9°F | Resp 16 | Ht 65.0 in | Wt 175.0 lb

## 2016-04-10 DIAGNOSIS — M160 Bilateral primary osteoarthritis of hip: Secondary | ICD-10-CM

## 2016-04-10 DIAGNOSIS — M5481 Occipital neuralgia: Secondary | ICD-10-CM

## 2016-04-10 DIAGNOSIS — G43109 Migraine with aura, not intractable, without status migrainosus: Secondary | ICD-10-CM

## 2016-04-10 DIAGNOSIS — M170 Bilateral primary osteoarthritis of knee: Secondary | ICD-10-CM

## 2016-04-10 DIAGNOSIS — Q8500 Neurofibromatosis, unspecified: Secondary | ICD-10-CM

## 2016-04-10 DIAGNOSIS — M533 Sacrococcygeal disorders, not elsewhere classified: Secondary | ICD-10-CM

## 2016-04-10 NOTE — Patient Instructions (Addendum)
PLAN   Continue present medications  Greater occipital nerve block to be performed at time return appointment  F/U PCP Dr.Meridth Soles  for evaliation of  BP and general medical  condition  F/U surgical evaluation.   F/U [place] as discussed  F/U neurological evaluation. Follow-up Dr. Melrose Nakayama as discussed .   May consider radiofrequency rhizolysis or intraspinal procedures pending response to present treatment and F/U evaluation . We  will avoid these procedures at this time  Patient to call [facility] should patient have concerns prior to scheduled return appointment.Occipital Nerve Block Patient Information  Description: The occipital nerves originate in the cervical (neck) spinal cord and travel upward through muscle and tissue to supply sensation to the back of the head and top of the scalp.  In addition, the nerves control some of the muscles of the scalp.  Occipital neuralgia is an irritation of these nerves which can cause headaches, numbness of the scalp, and neck discomfort.     The occipital nerve block will interrupt nerve transmission through these nerves and can relieve pain and spasm.  The block consists of insertion of a small needle under the skin in the back of the head to deposit local anesthetic (numbing medicine) and/or steroids around the nerve.  The entire block usually lasts less than 5 minutes.  Conditions which may be treated by occipital blocks:   Muscular pain and spasm of the scalp  Nerve irritation, back of the head  Headaches  Upper neck pain  Preparation for the injection:  1. Do not eat any solid food or dairy products within 8 hours of your appointment. 2. You may drink clear liquids up to 3 hours before appointment.  Clear liquids include water, black coffee, juice or soda.  No milk or cream please. 3. You may take your regular medication, including pain medications, with a sip of water before you appointment.   Diabetics should hold regular insulin (if taken separately) and take 1/2 normal NPH dose the morning of the procedure.  Carry some sugar containing items with you to your appointment. 4. A driver must accompany you and be prepared to drive you home after your procedure. 5. Bring all your current medications with you. 6. An IV may be inserted and sedation may be given at the discretion of the physician. 7. A blood pressure cuff, EKG, and other monitors will often be applied during the procedure.  Some patients may need to have extra oxygen administered for a short period. 8. You will be asked to provide medical information, including your allergies and medications, prior to the procedure.  We must know immediately if you are taking blood thinners (like Coumadin/Warfarin) or if you are allergic to IV iodine contrast (dye).  We must know if you could possible be pregnant.  9. Do not wear a high collared shirt or turtleneck.  Tie long hair up in the back if possible.  Possible side-effects:   Bleeding from needle site  Infection (rare, may require surgery)  Nerve injury (rare)  Hair on back of neck can be tinged with iodine scrub (this will wash out)  Light-headedness (temporary)  Pain at injection site (several days)  Decreased blood pressure (rare, temporary)  Seizure (very rare)  Call if you experience:   Hives or difficulty breathing ( go to the emergency room)  Inflammation or drainage at the injection site(s)  Please note:  Although the local anesthetic injected can often make your painful muscles or  headache feel good for several hours after the injection, the pain may return.  It takes 3-7 days for steroids to work.  You may not notice any pain relief for at least one week.  If effective, we will often do a series of injections spaced 3-6 weeks apart to maximally decrease your pain.  If you have any questions, please call [phone] [facility]

## 2016-04-10 NOTE — Progress Notes (Signed)
Patient here for f/up. Safety precautions to be maintained throughout the outpatient stay will include: orient to surroundings, keep bed in low position, maintain call bell within reach at all times, provide assistance with transfer out of bed and ambulation.

## 2016-04-10 NOTE — Progress Notes (Signed)
Subjective:    Patient ID: Valerie Mckinney, female    DOB: 1981-04-18, 35 y.o.   MRN: [id number]  HPI  The patient is a 35 year old female who returns to pain management for further evaluation and treatment of headaches as well as pain involving the upper extremities and lower back lower extremity regions. The patient states that she has had significant improvement of her headaches since undergoing treatment in pain management. The patient states that the frequency as well as intensity of her headaches has dramatically decreased. We discussed patient's pain involving the upper extremity region as well as the lumbar and lower extremity region and are awaiting recommendations from [place]. The patient denies any trauma change in events of daily living the call significant change in symptomatology the patient states that she continues to have significant pain of the cervical upper extremity region and lumbar lower extremity regions. We will consider patient for interventional treatment of headaches at time return appointment to continue to minimize the severity of headaches, decrease the frequency of headaches, and avoid the need for more involved treatment. Pending recommendations of [place] we will consider patient for additional modifications of treatment regimen. At the present time we will proceed with scheduling patient for greater occipital nerve block to be performed at time return appointment to minimize progression of patient's symptoms, decrease severity of symptoms, and avoid the need for more involved treatment. The patient states that the greater occipital nerve blocks having extremely effective in terms of decreasing severity of headaches and frequency of headaches The patient agreed to suggested treatment plan.       Review of Systems     Objective:   Physical Exam   There was tenderness of the paraspinal musculature region of the cervical region  cervical facet region palpation which reproduces mild/moderate discomfort with mild to moderate tenderness of the splenius capitis and occipitalis region. Palpation over the cervical facet and thoracic facet region reproduced mild to moderate discomfort with no crepitus of the thoracic region noted. There was mild tenderness of the temporomandibular joint region and no bounding pulsations of the temporal region were noted. There was no excessive tends to palpation of the region of the sinuses. The patient appeared to be with unremarkable Spurling's maneuver palpation of the acromioclavicular and glenohumeral joint regions reproducing moderate discomfort. The patient appeared to be unremarkable Spurling's maneuver. Palpation over the lumbar region lumbar facet region was attends to palpation of moderate degree with lateral bending rotation extension and palpation of the lumbar facets reproducing moderate discomfort. Palpation of the greater trochanteric region iliotibial band region was attends to palpation of mild to moderate degree with palpation over the PSIS and PII S regions reproducing moderate discomfort. There was no definite sensory deficit or dermatomal distribution detected there was negative clonus negative Homans. Examination of the scan reveal patient to be with skin tags and multiple regions without increased warmth and erythema in the region of the lesions. There was no sensory deficit or dermatomal distribution detected. There was negative clonus negative Homans. 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 No lumbar disc protrusions, spinal  or foraminal stenosis  Neurofibromatosis  Seizure disorder      PLAN   Continue present medications  Greater occipital nerve block to be performed at time return appointment  F/U PCP [place]  for evaliation of  BP and general medical  condition  F/U surgical evaluation.   F/U [place] as discussed  F/U neurological evaluation. Follow-up Dr. Melrose Nakayama as discussed .   May consider radiofrequency rhizolysis or intraspinal procedures pending response to present treatment and F/U evaluation . We  will avoid these procedures at this time  Patient to call [facility] Management Center should patient have concerns prior to scheduled return appointment.

## 2016-04-15 DIAGNOSIS — S92919A Unspecified fracture of unspecified toe(s), initial encounter for closed fracture: Secondary | ICD-10-CM

## 2016-04-15 HISTORY — DX: Unspecified fracture of unspecified toe(s), initial encounter for closed fracture: S92.919A

## 2016-04-19 LAB — TOXASSURE SELECT 13 (MW), URINE: PDF: 0

## 2016-05-01 ENCOUNTER — Encounter: Payer: Self-pay | Admitting: Pain Medicine

## 2016-05-01 ENCOUNTER — Ambulatory Visit: Payer: Medicare Other | Attending: Pain Medicine | Admitting: Pain Medicine

## 2016-05-01 VITALS — BP 118/70 | HR 74 | Temp 98.4°F | Resp 22 | Ht 65.0 in | Wt 160.0 lb

## 2016-05-01 DIAGNOSIS — M533 Sacrococcygeal disorders, not elsewhere classified: Secondary | ICD-10-CM

## 2016-05-01 DIAGNOSIS — M542 Cervicalgia: Secondary | ICD-10-CM | POA: Diagnosis present

## 2016-05-01 DIAGNOSIS — R51 Headache: Secondary | ICD-10-CM | POA: Insufficient documentation

## 2016-05-01 DIAGNOSIS — M170 Bilateral primary osteoarthritis of knee: Secondary | ICD-10-CM

## 2016-05-01 DIAGNOSIS — M5481 Occipital neuralgia: Secondary | ICD-10-CM

## 2016-05-01 DIAGNOSIS — G43109 Migraine with aura, not intractable, without status migrainosus: Secondary | ICD-10-CM

## 2016-05-01 DIAGNOSIS — Q8500 Neurofibromatosis, unspecified: Secondary | ICD-10-CM

## 2016-05-01 DIAGNOSIS — M160 Bilateral primary osteoarthritis of hip: Secondary | ICD-10-CM

## 2016-05-01 MED ORDER — BUPIVACAINE HCL (PF) 0.25 % IJ SOLN
30.0000 mL | Freq: Once | INTRAMUSCULAR | Status: AC
  Administered 2016-05-01: 30 mL
  Filled 2016-05-01: qty 30

## 2016-05-01 MED ORDER — ORPHENADRINE CITRATE 30 MG/ML IJ SOLN
60.0000 mg | Freq: Once | INTRAMUSCULAR | Status: AC
  Administered 2016-05-01: 60 mg via INTRAMUSCULAR
  Filled 2016-05-01: qty 2

## 2016-05-01 MED ORDER — LACTATED RINGERS IV SOLN
1000.0000 mL | INTRAVENOUS | Status: AC
  Administered 2016-05-01: 1000 mL via INTRAVENOUS

## 2016-05-01 MED ORDER — MIDAZOLAM HCL 5 MG/5ML IJ SOLN
5.0000 mg | Freq: Once | INTRAMUSCULAR | Status: AC
  Administered 2016-05-01: 5 mg via INTRAVENOUS
  Filled 2016-05-01: qty 5

## 2016-05-01 MED ORDER — FENTANYL CITRATE (PF) 100 MCG/2ML IJ SOLN
100.0000 ug | Freq: Once | INTRAMUSCULAR | Status: AC
  Administered 2016-05-01: 100 ug via INTRAVENOUS
  Filled 2016-05-01: qty 2

## 2016-05-01 MED ORDER — TRIAMCINOLONE ACETONIDE 40 MG/ML IJ SUSP
40.0000 mg | Freq: Once | INTRAMUSCULAR | Status: AC
  Administered 2016-05-01: 40 mg
  Filled 2016-05-01: qty 1

## 2016-05-01 NOTE — Patient Instructions (Addendum)
PLAN   Continue present medications  F/U PCP Dr.Meridth Soles  for evaliation of  BP and general medical  condition  F/U surgical evaluation.   F/U [place] as discussed  F/U neurological evaluation. Follow-up Dr. Melrose Nakayama as discussed .   May consider radiofrequency rhizolysis or intraspinal procedures pending response to present treatment and F/U evaluation . We  will avoid these procedures at this time  Patient to call Pain Management Center should patient have concerns prior to scheduled return appointment.Pain Management Discharge Instructions  General Discharge Instructions :  If you need to reach your doctor call: Monday-Friday 8:00 am - 4:00 pm at [phone] or toll free [phone].  After clinic hours [phone] to have operator reach doctor.  Bring all of your medication bottles to all your appointments in the pain clinic.  To cancel or reschedule your appointment with Pain Management please remember to call 24 hours in advance to avoid a fee.  Refer to the educational materials which you have been given on: General Risks, I had my Procedure. Discharge Instructions, Post Sedation.  Post Procedure Instructions:  The drugs you were given will stay in your system until tomorrow, so for the next 24 hours you should not drive, make any legal decisions or drink any alcoholic beverages.  You may eat anything you prefer, but it is better to start with liquids then soups and crackers, and gradually work up to solid foods.  Please notify your doctor immediately if you have any unusual bleeding, trouble breathing or pain that is not related to your normal pain.  Depending on the type of procedure that was done, some parts of your body may feel week and/or numb.  This usually clears up by tonight or the next day.  Walk with the use of an assistive device or accompanied by an adult for the 24 hours.  You may use ice on the affected area for the first 24 hours.   Put ice in a Ziploc bag and cover with a towel and place against area 15 minutes on 15 minutes off.  You may switch to heat after 24 hours.

## 2016-05-01 NOTE — Progress Notes (Signed)
Patient her for procedure d/t recurrent migraine headaches. Safety precautions to be maintained throughout the outpatient stay will include: orient to surroundings, keep bed in low position, maintain call bell within reach at all times, provide assistance with transfer out of bed and ambulation.

## 2016-05-01 NOTE — Progress Notes (Signed)
   Subjective:    Patient ID: Valerie Mckinney, female    DOB: [birth year], 35 y.o.   MRN: [id number]  HPI                                     BILATERAL OCCIPITAL NERVE BLOCKS  NOTE: The patient is a 35 y.o.-year-old female who returns to the Pain Management Center for further evaluation and treatment of pain consisting of pain involving the region of the neck and headache.  Patient Is with headache felt to be with significant component due to occipital neuralgia .  The risks, benefits, and expectations of the procedure have been discussed and explained to patient, who is understanding and wishes to proceed with interventional treatment as discussed and as explained to patient.  Will proceed with greater occipital nerve blocks with myoneural block injections at this time as discussed and as explained to patient.  All are understanding and in agreement with suggested treatment plan.    PROCEDURE:  Greater occipital nerve block on the left side with IV Versed, IV Fentanyl, conscious sedation, EKG, blood pressure, pulse, capnography, and pulse oximetry monitoring.  Procedure performed with patient in prone position.  Greater occipital nerve block on the left side.   With patient in prone position, Betadine prep of proposed entry site accomplished.  Following identification of the nuchal ridge, 22 -gauge needle was inserted at the level of the nuchal ridge medial to the occipital artery.  Following negative aspiration, 4cc 0.25% bupivacaine with Kenalog injected for left greater occipital nerve block.  Needle was removed.  Patient tolerated injection well.   Greater occipital nerve block on the rightt side. The greater occipital nerve block on the right side was performed exactly as the left greater occipital nerve block was performed and utilizing the same technique.  Myoneural block injections of the cervical region Following Betadine prep of proposed entry site a 22-gauge needle was inserted into  the cervical musculature region and following negative aspiration 2 cc of 0.25% bupivacaine with Norflex was injected for myoneural block injection of the cervical region times two.  The patient tolerated procedure well   A total of 10 mg Kenalog was utilized for the entire procedure.  PLAN:    1. Medications: Will continue presently prescribed medications at this time. 2. Patient to follow up with primary care physician Dr.Soles for evaluation of blood pressure and general medical condition status post procedure performed on today's visit. 3. Neurological evaluation for further assessment of headaches and for other studies as discussed. 4. Surgical evaluation as discussed.  5. F/U [place] as discussed  6. Patient may be candidate for Botox injections, radiofrequency procedures, as well as implantation type procedures pending response to treatment rendered on today's visit and pending follow-up evaluation. 7. Patient has been advised to adhere to proper body mechanics and to avoid activities which appear to aggravate condition.cations:  Will continue presently prescribed medications at this time. 8. The patient is understanding and in agreement with the suggested treatment plan.   Review of Systems     Objective:   Physical Exam        Assessment & Plan:

## 2016-05-02 ENCOUNTER — Telehealth: Payer: Self-pay | Admitting: *Deleted

## 2016-05-02 NOTE — Telephone Encounter (Signed)
Spoke with patient, denies any questions or concerns from procedure on yesterday.

## 2016-05-18 ENCOUNTER — Ambulatory Visit: Payer: Medicare Other | Admitting: Pain Medicine

## 2017-02-28 ENCOUNTER — Encounter: Payer: Self-pay | Admitting: Anesthesiology

## 2017-02-28 ENCOUNTER — Ambulatory Visit: Payer: Medicare Other | Attending: Anesthesiology | Admitting: Anesthesiology

## 2017-02-28 ENCOUNTER — Encounter (INDEPENDENT_AMBULATORY_CARE_PROVIDER_SITE_OTHER): Payer: Self-pay

## 2017-02-28 VITALS — BP 129/80 | HR 106 | Temp 99.5°F | Resp 18 | Ht 65.0 in | Wt 180.0 lb

## 2017-02-28 DIAGNOSIS — D649 Anemia, unspecified: Secondary | ICD-10-CM | POA: Insufficient documentation

## 2017-02-28 DIAGNOSIS — Q8500 Neurofibromatosis, unspecified: Secondary | ICD-10-CM | POA: Diagnosis not present

## 2017-02-28 DIAGNOSIS — G43109 Migraine with aura, not intractable, without status migrainosus: Secondary | ICD-10-CM | POA: Insufficient documentation

## 2017-02-28 DIAGNOSIS — M170 Bilateral primary osteoarthritis of knee: Secondary | ICD-10-CM

## 2017-02-28 DIAGNOSIS — M533 Sacrococcygeal disorders, not elsewhere classified: Secondary | ICD-10-CM | POA: Diagnosis not present

## 2017-02-28 DIAGNOSIS — G894 Chronic pain syndrome: Secondary | ICD-10-CM | POA: Diagnosis not present

## 2017-02-28 DIAGNOSIS — M25512 Pain in left shoulder: Secondary | ICD-10-CM | POA: Insufficient documentation

## 2017-02-28 DIAGNOSIS — M25511 Pain in right shoulder: Secondary | ICD-10-CM | POA: Insufficient documentation

## 2017-02-28 DIAGNOSIS — M160 Bilateral primary osteoarthritis of hip: Secondary | ICD-10-CM | POA: Insufficient documentation

## 2017-02-28 DIAGNOSIS — R569 Unspecified convulsions: Secondary | ICD-10-CM | POA: Diagnosis not present

## 2017-02-28 DIAGNOSIS — M5481 Occipital neuralgia: Secondary | ICD-10-CM | POA: Insufficient documentation

## 2017-02-28 DIAGNOSIS — M545 Low back pain: Secondary | ICD-10-CM | POA: Diagnosis present

## 2017-02-28 DIAGNOSIS — K219 Gastro-esophageal reflux disease without esophagitis: Secondary | ICD-10-CM | POA: Diagnosis not present

## 2017-02-28 DIAGNOSIS — Z8249 Family history of ischemic heart disease and other diseases of the circulatory system: Secondary | ICD-10-CM | POA: Diagnosis not present

## 2017-02-28 DIAGNOSIS — Z79899 Other long term (current) drug therapy: Secondary | ICD-10-CM | POA: Insufficient documentation

## 2017-02-28 DIAGNOSIS — F17210 Nicotine dependence, cigarettes, uncomplicated: Secondary | ICD-10-CM | POA: Diagnosis not present

## 2017-02-28 NOTE — Progress Notes (Signed)
Subjective:  Patient ID: Valerie Mckinney, female    DOB: 07-May-1981  Age: 36 y.o. MRN: [id number]  CC: Migraine; Back Pain (low); and Shoulder Pain (bilateral)     PROCEDURE:none  HPI Valerie Mckinney presents for a new patient evaluation. She is a pleasant 36 year old black female with long-standing history of chronic headaches and diffuse body pain affecting multiple areas. She describes pain that arises in the posterior neck region with radiation into the back of the head with a tension like bandlike distribution for which she has had Dr. Primus Bravo do possible nerve blocks which sterilely give her 2-3 months of relief and reduce the frequency and severity of her migraine headaches.  She also reports left and right shoulder pain breast and low back pain of a chronic nature. This is a dull aching pain that is present frequently throughout the day described as heavy aching and annoying shooting and throbbing. The pain does not appear to be influenced by time of daybut is worse with climbing lifting kneeling standing squat and better with warm compresses medication management and hot packs. In the past she has had oxycodone 5 mg tablets which are given her good relief per Dr. Gurney Maxin. He has requested that we evaluate her and assist with medication management as well. She has reportedly failed conservative management and physical therapy and with her history of neurofibromatosis this plays into her issues she states.  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   She has a past surgical history that includes Cholecystectomy; Fracture surgery (Right); Back surgery; Hand surgery (Right); Other surgical history (Right); Breast surgery (Right); Exploratory laparotomy; and Tubal ligation.   Her family history includes Cancer in her father; Heart disease in her mother;  Neurofibromatosis in her brother; Sickle cell trait in her brother.She reports that she has been smoking Cigarettes.  She has been smoking about 0.20 packs per day. She has never used smokeless tobacco. She reports that she does not drink alcohol or use drugs.    ToxAssure Select 13  Date Value Ref Range Status  04/10/2016 FINAL  Final    Comment:    ==================================================================== TOXASSURE SELECT 13 (MW) ==================================================================== Test                             Result       Flag       Units Drug Absent but Declared for Prescription Verification   Oxycodone                      Not Detected UNEXPECTED ng/mg creat ==================================================================== Test                      Result    Flag   Units      Ref Range   Creatinine              194              mg/dL      >=20 ==================================================================== Declared Medications:  The flagging and interpretation on this report are based on the  following declared medications.  Unexpected results may arise from  inaccuracies in the declared medications.  **Note: The testing scope of this panel includes these medications:  Oxycodone (Oxy IR)  **Note: The testing scope of this panel does not include  following  reported medications:  Gabapentin (Neurontin)  Levetiracetam (Keppra)  Nortriptyline (Pamelor) ==================================================================== For clinical consultation, please call [phone]. ====================================================================     Outpatient Medications Prior to Visit  Medication Sig Dispense Refill  . gabapentin (NEURONTIN) 100 MG capsule Take 300 mg by mouth 3 (three) times daily.    Marland Kitchen levETIRAcetam (KEPPRA) 500 MG tablet Take 1,500 mg by mouth 2 (two) times daily.     . nortriptyline (PAMELOR) 10 MG capsule Take 20 mg by  mouth at bedtime.      Facility-Administered Medications Prior to Visit  Medication Dose Route Frequency Provider Last Rate Last Dose  . bupivacaine (PF) (MARCAINE) 0.25 % injection 30 mL  30 mL Other Once Mohammed Kindle, MD      . fentaNYL (SUBLIMAZE) injection 100 mcg  100 mcg Intravenous Once Mohammed Kindle, MD      . lactated ringers infusion 1,000 mL  1,000 mL Intravenous Continuous Mohammed Kindle, MD      . lactated ringers infusion 1,000 mL  1,000 mL Intravenous Continuous Mohammed Kindle, MD 125 mL/hr at 05/01/16 1037 1,000 mL at 05/01/16 1037  . midazolam (VERSED) 5 MG/5ML injection 5 mg  5 mg Intravenous Once Mohammed Kindle, MD      . sodium chloride flush (NS) 0.9 % injection 20 mL  20 mL Other Once Mohammed Kindle, MD      . sodium chloride flush (NS) 0.9 % injection 20 mL  20 mL Other Once Mohammed Kindle, MD      . triamcinolone acetonide (KENALOG-40) injection 40 mg  40 mg Other Once Mohammed Kindle, MD       Lab Results  Component Value Date   WBC 6.0 11/02/2015   HGB 11.5 (L) 11/02/2015   HCT 36.7 11/02/2015   PLT 214 11/02/2015   GLUCOSE 106 (H) 11/02/2015   ALT 13 (L) 11/02/2015   AST 14 (L) 11/02/2015   NA 136 11/02/2015   K 3.9 11/02/2015   CL 105 11/02/2015   CREATININE 0.66 11/02/2015   BUN 7 11/02/2015   CO2 25 11/02/2015    --------------------------------------------------------------------------------------------------------------------- Mr Knee Right Wo Contrast  Result Date: 12/08/2015 CLINICAL DATA:  Chronic progressive diffuse right knee pain but primarily anterior. Painful range of motion. Popping. EXAM: MRI OF THE RIGHT KNEE WITHOUT CONTRAST TECHNIQUE: Multiplanar, multisequence MR imaging of the knee was performed. No intravenous contrast was administered. COMPARISON:  Lower leg radiographs dated 07/28/2005 FINDINGS: MENISCI Medial meniscus:  Normal. Lateral meniscus:  Normal. LIGAMENTS Cruciates:  Normal. Collaterals:  Normal. CARTILAGE Patellofemoral:   Normal. Medial:  Normal. Lateral:  Normal. Joint:  Normal. Popliteal Fossa:  Normal. Extensor Mechanism:  Normal. Bones:  Normal. IMPRESSION: Normal MRI of the right knee. Electronically Signed   By: Lorriane Shire M.D.   On: 12/08/2015 13:52       ---------------------------------------------------------------------------------------------------------------------- Past Medical History:  Diagnosis Date  . Allergy   . Anemia   . GERD (gastroesophageal reflux disease)   . 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 Seizures ([place])     Past Surgical History:  Procedure Laterality Date  . BACK SURGERY     tumor removed  . BREAST SURGERY Right    benign tumor removed  . CHOLECYSTECTOMY    . EXPLORATORY LAPAROTOMY    . FRACTURE SURGERY Right    ankle  . HAND SURGERY Right    tumor removed  . OTHER SURGICAL HISTORY Right    arm (3 tumors removed)  . TUBAL LIGATION  Family History  Problem Relation Age of Onset  . Heart disease Mother   . Cancer Father   . Neurofibromatosis Brother   . Sickle cell trait Brother     Social History  Substance Use Topics  . Smoking status: Current Every Day Smoker    Packs/day: 0.20    Types: Cigarettes  . Smokeless tobacco: Never Used  . Alcohol use No    ---------------------------------------------------------------------------------------------------------------------  Scheduled Meds: Continuous Infusions: PRN Meds:.   BP 129/80   Pulse (!) 106   Temp 99.5 F (37.5 C) (Oral)   Resp 18   Ht 5\' 5"  (1.651 m)   Wt 180 lb (81.6 kg)   LMP 01/29/2017   SpO2 100%   BMI 29.95 kg/m    BP Readings from Last 3 Encounters:  02/28/17 129/80  05/01/16 118/70  04/10/16 118/74     Wt Readings from Last 3 Encounters:  02/28/17 180 lb (81.6 kg)  05/01/16 160 lb (72.6 kg)  04/10/16 175 lb (79.4 kg)      ----------------------------------------------------------------------------------------------------------------------  ROS Review of Systems  Cardiac: No angina Pulmonary: No shortness of breath Neurologic: History of seizure disorder and as above Psychological: No suicidal or homicidal ideation and no history of drug abuse GI: Occasional reflux  Objective:  BP 129/80   Pulse (!) 106   Temp 99.5 F (37.5 C) (Oral)   Resp 18   Ht 5\' 5"  (1.651 m)   Wt 180 lb (81.6 kg)   LMP 01/29/2017   SpO2 100%   BMI 29.95 kg/m   Physical Exam  Patient is alert oriented cooperative compliant and a good historian Pupils are equally round reactive to light extraocular muscles intact Heart is regular rate and rhythm without murmur Lungs are clear to auscultation Abdomen deferred Lumbar region reveals some mild pain in the paraspinous muscle and a well-healed scar right side over the SI region from previous neurofibroma resection. Gen. Exam also reveals multiple small neurofibromas over the abdomen lumbar region and upper chest and arms.She has some mild tenderness with palpation over the greater occipital notch bilaterally precipitating the tension headaches and tenderness that she describeshe ambulates well.     Assessment & Plan:   Valerie Mckinney was seen today for migraine, back pain and shoulder pain.  Diagnoses and all orders for this visit:  Bilateral occipital neuralgia -     GREATER OCCIPITAL NERVE BLOCK; Future  Migraine with aura and without status migrainosus, not intractable  Primary osteoarthritis of both hips  Primary osteoarthritis of both knees  Sacroiliac joint dysfunction  Neurofibromatosis ([place]) -     ToxASSURE Select 13 (MW), Urine -     ToxASSURE Select 13 (MW), Urine  Chronic pain syndrome -     ToxASSURE Select 13 (MW), Urine -     ToxASSURE Select 13 (MW),  Urine     ----------------------------------------------------------------------------------------------------------------------  Problem List Items Addressed This Visit      Cardiovascular and Mediastinum   Migraine   Relevant Medications   topiramate (TOPAMAX) 15 MG capsule     Nervous and Auditory   Neurofibromatosis ([place])   Relevant Orders   ToxASSURE Select 13 (MW), Urine   ToxASSURE Select 13 (MW), Urine     Musculoskeletal and Integument   DJD (degenerative joint disease) of knee   Sacroiliac joint dysfunction     Other   Bilateral occipital neuralgia - Primary   Relevant Orders   GREATER OCCIPITAL NERVE BLOCK    Other Visit Diagnoses  Primary osteoarthritis of both hips       Chronic pain syndrome       Relevant Orders   ToxASSURE Select 13 (MW), Urine   ToxASSURE Select 13 (MW), Urine      ----------------------------------------------------------------------------------------------------------------------  1. Bilateral occipital neuralgia We will plan on repeat greater also nerve blocks for her as these helped reduce the tension headache component and diminished tigraine headaches. - GREATER OCCIPITAL NERVE BLOCK; Future  2. Migraine with aura and without status migrainosus, not intractable As above and continue follow-up with her neurologist.  3. Primary osteoarthritis of both hips   4. Primary osteoarthritis of both knees   5. Sacroiliac joint dysfunction   6. Neurofibromatosis ([place]) As above. In the past she has had Dr. Melrose Nakayama right for her oxycodone 5 mg tablets for which she is taking these day. It may be reasonable for her to continue on a low dose of this medication. Urine drug screen will be requested today - ToxASSURE Select 13 (MW), Urine - ToxASSURE Select 13 (MW), Urine  7. Chronic pain syndrome As above with return to clinic in the next few weeks for reevaluation and possible repeat injection for the greater occipital nerves. -  ToxASSURE Select 13 (MW), Urine - ToxASSURE Select 13 (MW), Urine    ----------------------------------------------------------------------------------------------------------------------  I am having Ms. Pflug maintain her levETIRAcetam, nortriptyline, gabapentin, and topiramate. We will continue to administer bupivacaine (PF), triamcinolone acetonide, sodium chloride flush, sodium chloride flush, fentaNYL, lactated ringers, midazolam, and lactated ringers.   Meds ordered this encounter  Medications  . topiramate (TOPAMAX) 15 MG capsule    Sig: Take 15 mg by mouth at bedtime.       Follow-up: Return in about 1 month (around 03/31/2017) for evaluation, med refill--Procedure.    Molli Barrows, MD 4:23 PM  The [place] practitioner database for opioid medications on this patient has been reviewed by me and my staff   Greater than 50% of the total encounter time was spent in counseling and / or coordination of care.     This dictation was performed utilizing Systems analyst.  Please excuse any unintentional or mistaken typographical errors as a result.

## 2017-02-28 NOTE — Patient Instructions (Addendum)
Pain Management Discharge Instructions  General Discharge Instructions :  If you need to reach your doctor call: Monday-Friday 8:00 am - 4:00 pm at [phone] or toll free [phone].  After clinic hours [phone] to have operator reach doctor.  Bring all of your medication bottles to all your appointments in the pain clinic.  To cancel or reschedule your appointment with Pain Management please remember to call 24 hours in advance to avoid a fee.  Refer to the educational materials which you have been given on: General Risks, I had my Procedure. Discharge Instructions, Post Sedation.  Post Procedure Instructions:  The drugs you were given will stay in your system until tomorrow, so for the next 24 hours you should not drive, make any legal decisions or drink any alcoholic beverages.  You may eat anything you prefer, but it is better to start with liquids then soups and crackers, and gradually work up to solid foods.  Please notify your doctor immediately if you have any unusual bleeding, trouble breathing or pain that is not related to your normal pain.  Depending on the type of procedure that was done, some parts of your body may feel week and/or numb.  This usually clears up by tonight or the next day.  Walk with the use of an assistive device or accompanied by an adult for the 24 hours.  You may use ice on the affected area for the first 24 hours.  Put ice in a Ziploc bag and cover with a towel and place against area 15 minutes on 15 minutes off.  You may switch to heat after 24 hours.GENERAL RISKS AND COMPLICATIONS  What are the risk, side effects and possible complications? Generally speaking, most procedures are safe.  However, with any procedure there are risks, side effects, and the possibility of complications.  The risks and complications are dependent upon the sites that are lesioned, or the type of nerve block to be performed.  The closer the procedure is to the spine,  the more serious the risks are.  Great care is taken when placing the radio frequency needles, block needles or lesioning probes, but sometimes complications can occur. 1. Infection: Any time there is an injection through the skin, there is a risk of infection.  This is why sterile conditions are used for these blocks.  There are four possible types of infection. 1. Localized skin infection. 2. Central Nervous System Infection-This can be in the form of Meningitis, which can be deadly. 3. Epidural Infections-This can be in the form of an epidural abscess, which can cause pressure inside of the spine, causing compression of the spinal cord with subsequent paralysis. This would require an emergency surgery to decompress, and there are no guarantees that the patient would recover from the paralysis. 4. Discitis-This is an infection of the intervertebral discs.  It occurs in about 1% of discography procedures.  It is difficult to treat and it may lead to surgery.        2. Pain: the needles have to go through skin and soft tissues, will cause soreness.       3. Damage to internal structures:  The nerves to be lesioned may be near blood vessels or    other nerves which can be potentially damaged.       4. Bleeding: Bleeding is more common if the patient is taking blood thinners such as  aspirin, Coumadin, Ticiid, Plavix, etc., or if he/she have some genetic predisposition  such as   hemophilia. Bleeding into the spinal canal can cause compression of the spinal  cord with subsequent paralysis.  This would require an emergency surgery to  decompress and there are no guarantees that the patient would recover from the  paralysis.       5. Pneumothorax:  Puncturing of a lung is a possibility, every time a needle is introduced in  the area of the chest or upper back.  Pneumothorax refers to free air around the  collapsed lung(s), inside of the thoracic cavity (chest cavity).  Another two possible  complications  related to a similar event would include: Hemothorax and Chylothorax.   These are variations of the Pneumothorax, where instead of air around the collapsed  lung(s), you may have blood or chyle, respectively.       6. Spinal headaches: They may occur with any procedures in the area of the spine.       7. Persistent CSF (Cerebro-Spinal Fluid) leakage: This is a rare problem, but may occur  with prolonged intrathecal or epidural catheters either due to the formation of a fistulous  track or a dural tear.       8. Nerve damage: By working so close to the spinal cord, there is always a possibility of  nerve damage, which could be as serious as a permanent spinal cord injury with  paralysis.       9. Death:  Although rare, severe deadly allergic reactions known as "Anaphylactic  reaction" can occur to any of the medications used.      10. Worsening of the symptoms:  We can always make thing worse.  What are the chances of something like this happening? Chances of any of this occuring are extremely low.  By statistics, you have more of a chance of getting killed in a motor vehicle accident: while driving to the hospital than any of the above occurring .  Nevertheless, you should be aware that they are possibilities.  In general, it is similar to taking a shower.  Everybody knows that you can slip, hit your head and get killed.  Does that mean that you should not shower again?  Nevertheless always keep in mind that statistics do not mean anything if you happen to be on the wrong side of them.  Even if a procedure has a 1 (one) in a 1,000,000 (million) chance of going wrong, it you happen to be that one..Also, keep in mind that by statistics, you have more of a chance of having something go wrong when taking medications.  Who should not have this procedure? If you are on a blood thinning medication (e.g. Coumadin, Plavix, see list of "Blood Thinners"), or if you have an active infection going on, you should not  have the procedure.  If you are taking any blood thinners, please inform your physician.  How should I prepare for this procedure?  Do not eat or drink anything at least six hours prior to the procedure.  Bring a driver with you .  It cannot be a taxi.  Come accompanied by an adult that can drive you back, and that is strong enough to help you if your legs get weak or numb from the local anesthetic.  Take all of your medicines the morning of the procedure with just enough water to swallow them.  If you have diabetes, make sure that you are scheduled to have your procedure done first thing in the morning, whenever possible.  If you have diabetes,   take only half of your insulin dose and notify our nurse that you have done so as soon as you arrive at the clinic.  If you are diabetic, but only take blood sugar pills (oral hypoglycemic), then do not take them on the morning of your procedure.  You may take them after you have had the procedure.  Do not take aspirin or any aspirin-containing medications, at least eleven (11) days prior to the procedure.  They may prolong bleeding.  Wear loose fitting clothing that may be easy to take off and that you would not mind if it got stained with Betadine or blood.  Do not wear any jewelry or perfume  Remove any nail coloring.  It will interfere with some of our monitoring equipment.  NOTE: Remember that this is not meant to be interpreted as a complete list of all possible complications.  Unforeseen problems may occur.  BLOOD THINNERS The following drugs contain aspirin or other products, which can cause increased bleeding during surgery and should not be taken for 2 weeks prior to and 1 week after surgery.  If you should need take something for relief of minor pain, you may take acetaminophen which is found in Tylenol,m Datril, Anacin-3 and Panadol. It is not blood thinner. The products listed below are.  Do not take any of the products listed below  in addition to any listed on your instruction sheet.  A.P.C or A.P.C with Codeine Codeine Phosphate Capsules #3 Ibuprofen Ridaura  ABC compound Congesprin Imuran rimadil  Advil Cope Indocin Robaxisal  Alka-Seltzer Effervescent Pain Reliever and Antacid Coricidin or Coricidin-D  Indomethacin Rufen  Alka-Seltzer plus Cold Medicine Cosprin Ketoprofen S-A-C Tablets  Anacin Analgesic Tablets or Capsules Coumadin Korlgesic Salflex  Anacin Extra Strength Analgesic tablets or capsules CP-2 Tablets Lanoril Salicylate  Anaprox Cuprimine Capsules Levenox Salocol  Anexsia-D Dalteparin Magan Salsalate  Anodynos Darvon compound Magnesium Salicylate Sine-off  Ansaid Dasin Capsules Magsal Sodium Salicylate  Anturane Depen Capsules Marnal Soma  APF Arthritis pain formula Dewitt's Pills Measurin Stanback  Argesic Dia-Gesic Meclofenamic Sulfinpyrazone  Arthritis Bayer Timed Release Aspirin Diclofenac Meclomen Sulindac  Arthritis pain formula Anacin Dicumarol Medipren Supac  Analgesic (Safety coated) Arthralgen Diffunasal Mefanamic Suprofen  Arthritis Strength Bufferin Dihydrocodeine Mepro Compound Suprol  Arthropan liquid Dopirydamole Methcarbomol with Aspirin Synalgos  ASA tablets/Enseals Disalcid Micrainin Tagament  Ascriptin Doan's Midol Talwin  Ascriptin A/D Dolene Mobidin Tanderil  Ascriptin Extra Strength Dolobid Moblgesic Ticlid  Ascriptin with Codeine Doloprin or Doloprin with Codeine Momentum Tolectin  Asperbuf Duoprin Mono-gesic Trendar  Aspergum Duradyne Motrin or Motrin IB Triminicin  Aspirin plain, buffered or enteric coated Durasal Myochrisine Trigesic  Aspirin Suppositories Easprin Nalfon Trillsate  Aspirin with Codeine Ecotrin Regular or Extra Strength Naprosyn Uracel  Atromid-S Efficin Naproxen Ursinus  Auranofin Capsules Elmiron Neocylate Vanquish  Axotal Emagrin Norgesic Verin  Azathioprine Empirin or Empirin with Codeine Normiflo Vitamin E  Azolid Emprazil Nuprin Voltaren  Bayer  Aspirin plain, buffered or children's or timed BC Tablets or powders Encaprin Orgaran Warfarin Sodium  Buff-a-Comp Enoxaparin Orudis Zorpin  Buff-a-Comp with Codeine Equegesic Os-Cal-Gesic   Buffaprin Excedrin plain, buffered or Extra Strength Oxalid   Bufferin Arthritis Strength Feldene Oxphenbutazone   Bufferin plain or Extra Strength Feldene Capsules Oxycodone with Aspirin   Bufferin with Codeine Fenoprofen Fenoprofen Pabalate or Pabalate-SF   Buffets II Flogesic Panagesic   Buffinol plain or Extra Strength Florinal or Florinal with Codeine Panwarfarin   Buf-Tabs Flurbiprofen Penicillamine   Butalbital Compound Four-way cold tablets   Penicillin   Butazolidin Fragmin Pepto-Bismol   Carbenicillin Geminisyn Percodan   Carna Arthritis Reliever Geopen Persantine   Carprofen Gold's salt Persistin   Chloramphenicol Goody's Phenylbutazone   Chloromycetin Haltrain Piroxlcam   Clmetidine heparin Plaquenil   Cllnoril Hyco-pap Ponstel   Clofibrate Hydroxy chloroquine Propoxyphen         Before stopping any of these medications, be sure to consult the physician who ordered them.  Some, such as Coumadin (Warfarin) are ordered to prevent or treat serious conditions such as "deep thrombosis", "pumonary embolisms", and other heart problems.  The amount of time that you may need off of the medication may also vary with the medication and the reason for which you were taking it.  If you are taking any of these medications, please make sure you notify your pain physician before you undergo any procedures.         Occipital Nerve Block Patient Information  Description: The occipital nerves originate in the cervical (neck) spinal cord and travel upward through muscle and tissue to supply sensation to the back of the head and top of the scalp.  In addition, the nerves control some of the muscles of the scalp.  Occipital neuralgia is an irritation of these nerves which can cause headaches, numbness of  the scalp, and neck discomfort.     The occipital nerve block will interrupt nerve transmission through these nerves and can relieve pain and spasm.  The block consists of insertion of a small needle under the skin in the back of the head to deposit local anesthetic (numbing medicine) and/or steroids around the nerve.  The entire block usually lasts less than 5 minutes.  Conditions which may be treated by occipital blocks:   Muscular pain and spasm of the scalp  Nerve irritation, back of the head  Headaches  Upper neck pain  Preparation for the injection:  12. Do not eat any solid food or dairy products within 8 hours of your appointment. 13. You may drink clear liquids up to 3 hours before appointment.  Clear liquids include water, black coffee, juice or soda.  No milk or cream please. 14. You may take your regular medication, including pain medications, with a sip of water before you appointment.  Diabetics should hold regular insulin (if taken separately) and take 1/2 normal NPH dose the morning of the procedure.  Carry some sugar containing items with you to your appointment. 15. A driver must accompany you and be prepared to drive you home after your procedure. 16. Bring all your current medications with you. 17. An IV may be inserted and sedation may be given at the discretion of the physician. 18. A blood pressure cuff, EKG, and other monitors will often be applied during the procedure.  Some patients may need to have extra oxygen administered for a short period. 19. You will be asked to provide medical information, including your allergies and medications, prior to the procedure.  We must know immediately if you are taking blood thinners (like Coumadin/Warfarin) or if you are allergic to IV iodine contrast (dye).  We must know if you could possible be pregnant.  20. Do not wear a high collared shirt or turtleneck.  Tie long hair up in the back if possible.  Possible  side-effects:   Bleeding from needle site  Infection (rare, may require surgery)  Nerve injury (rare)  Hair on back of neck can be tinged with iodine scrub (this will wash out)  Light-headedness (temporary)    Pain at injection site (several days)  Decreased blood pressure (rare, temporary)  Seizure (very rare)  Call if you experience:   Hives or difficulty breathing ( go to the emergency room)  Inflammation or drainage at the injection site(s)  Please note:  Although the local anesthetic injected can often make your painful muscles or headache feel good for several hours after the injection, the pain may return.  It takes 3-7 days for steroids to work.  You may not notice any pain relief for at least one week.  If effective, we will often do a series of injections spaced 3-6 weeks apart to maximally decrease your pain.  If you have any questions, please call [phone]  [facility]  

## 2017-03-06 LAB — TOXASSURE SELECT 13 (MW), URINE

## 2017-03-14 ENCOUNTER — Ambulatory Visit: Payer: Medicare Other | Attending: Anesthesiology | Admitting: Anesthesiology

## 2017-03-14 ENCOUNTER — Encounter: Payer: Self-pay | Admitting: Anesthesiology

## 2017-03-14 VITALS — BP 126/85 | HR 82 | Temp 98.3°F | Resp 18 | Ht 65.0 in | Wt 185.0 lb

## 2017-03-14 DIAGNOSIS — G43109 Migraine with aura, not intractable, without status migrainosus: Secondary | ICD-10-CM | POA: Insufficient documentation

## 2017-03-14 DIAGNOSIS — Q8500 Neurofibromatosis, unspecified: Secondary | ICD-10-CM | POA: Diagnosis not present

## 2017-03-14 DIAGNOSIS — M533 Sacrococcygeal disorders, not elsewhere classified: Secondary | ICD-10-CM | POA: Diagnosis not present

## 2017-03-14 DIAGNOSIS — F17210 Nicotine dependence, cigarettes, uncomplicated: Secondary | ICD-10-CM | POA: Insufficient documentation

## 2017-03-14 DIAGNOSIS — M5481 Occipital neuralgia: Secondary | ICD-10-CM | POA: Diagnosis not present

## 2017-03-14 DIAGNOSIS — R569 Unspecified convulsions: Secondary | ICD-10-CM | POA: Diagnosis not present

## 2017-03-14 DIAGNOSIS — Z79899 Other long term (current) drug therapy: Secondary | ICD-10-CM | POA: Diagnosis not present

## 2017-03-14 DIAGNOSIS — M170 Bilateral primary osteoarthritis of knee: Secondary | ICD-10-CM | POA: Diagnosis not present

## 2017-03-14 DIAGNOSIS — G894 Chronic pain syndrome: Secondary | ICD-10-CM | POA: Diagnosis not present

## 2017-03-14 DIAGNOSIS — M259 Joint disorder, unspecified: Secondary | ICD-10-CM | POA: Insufficient documentation

## 2017-03-14 DIAGNOSIS — K219 Gastro-esophageal reflux disease without esophagitis: Secondary | ICD-10-CM | POA: Insufficient documentation

## 2017-03-14 DIAGNOSIS — M160 Bilateral primary osteoarthritis of hip: Secondary | ICD-10-CM | POA: Diagnosis not present

## 2017-03-14 MED ORDER — OXYCODONE HCL 5 MG PO TABS
5.0000 mg | ORAL_TABLET | Freq: Two times a day (BID) | ORAL | 0 refills | Status: DC
Start: 2017-03-14 — End: 2017-03-30

## 2017-03-14 NOTE — Progress Notes (Signed)
Safety precautions to be maintained throughout the outpatient stay will include: orient to surroundings, keep bed in low position, maintain call bell within reach at all times, provide assistance with transfer out of bed and ambulation.  

## 2017-03-14 NOTE — Patient Instructions (Signed)
You were given 1 prescription for Oxycodone today. Bring your pill bottle to your next med refill apointment for pill count  Occipital Nerve Block Patient Information  Description: The occipital nerves originate in the cervical (neck) spinal cord and travel upward through muscle and tissue to supply sensation to the back of the head and top of the scalp.  In addition, the nerves control some of the muscles of the scalp.  Occipital neuralgia is an irritation of these nerves which can cause headaches, numbness of the scalp, and neck discomfort.     The occipital nerve block will interrupt nerve transmission through these nerves and can relieve pain and spasm.  The block consists of insertion of a small needle under the skin in the back of the head to deposit local anesthetic (numbing medicine) and/or steroids around the nerve.  The entire block usually lasts less than 5 minutes.  Conditions which may be treated by occipital blocks:   Muscular pain and spasm of the scalp  Nerve irritation, back of the head  Headaches  Upper neck pain  Preparation for the injection:  1. Do not eat any solid food or dairy products within 8 hours of your appointment. 2. You may drink clear liquids up to 3 hours before appointment.  Clear liquids include water, black coffee, juice or soda.  No milk or cream please. 3. You may take your regular medication, including pain medications, with a sip of water before you appointment.  Diabetics should hold regular insulin (if taken separately) and take 1/2 normal NPH dose the morning of the procedure.  Carry some sugar containing items with you to your appointment. 4. A driver must accompany you and be prepared to drive you home after your procedure. 5. Bring all your current medications with you. 6. An IV may be inserted and sedation may be given at the discretion of the physician. 7. A blood pressure cuff, EKG, and other monitors will often be applied during the  procedure.  Some patients may need to have extra oxygen administered for a short period. 8. You will be asked to provide medical information, including your allergies and medications, prior to the procedure.  We must know immediately if you are taking blood thinners (like Coumadin/Warfarin) or if you are allergic to IV iodine contrast (dye).  We must know if you could possible be pregnant.  9. Do not wear a high collared shirt or turtleneck.  Tie long hair up in the back if possible.  Possible side-effects:   Bleeding from needle site  Infection (rare, may require surgery)  Nerve injury (rare)  Hair on back of neck can be tinged with iodine scrub (this will wash out)  Light-headedness (temporary)  Pain at injection site (several days)  Decreased blood pressure (rare, temporary)  Seizure (very rare)  Call if you experience:   Hives or difficulty breathing ( go to the emergency room)  Inflammation or drainage at the injection site(s)  Please note:  Although the local anesthetic injected can often make your painful muscles or headache feel good for several hours after the injection, the pain may return.  It takes 3-7 days for steroids to work.  You may not notice any pain relief for at least one week.  If effective, we will often do a series of injections spaced 3-6 weeks apart to maximally decrease your pain.  If you have any questions, please call [phone] [facility]

## 2017-03-16 NOTE — Progress Notes (Signed)
Subjective:  Patient ID: Valerie Mckinney, female    DOB: [birth year]  Age: 36 y.o. MRN: [id number]  CC: Headache   Service Provided on Last Visit: Evaluation PROCEDURE:none  HPI Valerie Mckinney presents for reevaluation. She continues to have posterior cervical headaches with pain that radiates up over the back of her head and a tension like viselike grip. The quality characteristic addition we should've these headaches a been stable in nature. In the past she has been on oxycodone which has helped relieve these headaches when they occur. She is also used this for her diffuse body pain and osteoarthritis and it has been effective for her pain management. She denies any diverting or illicit use with that medication and based on her narcotic assessment sheet she gains good lifestyle support and relief with the medication. Otherwise she is in her usual state of health today.  By history she has left and right shoulder pain breast and low back pain of a chronic nature. This is a dull aching pain that is present frequently throughout the day described as heavy aching and annoying shooting and throbbing. The pain does not appear to be influenced by time of daybut is worse with climbing lifting kneeling standing squat and better with warm compresses medication management and hot packs. In the past she has had oxycodone 5 mg tablets which are given her good relief per Dr. Gurney Maxin. He has requested that we evaluate her and assist with medication management as well. She has reportedly failed conservative management and physical therapy and with her history of neurofibromatosis this plays into her issues she states.  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   She has a past surgical history that includes Cholecystectomy; Fracture surgery (Right); Back surgery; Hand surgery  (Right); Other surgical history (Right); Breast surgery (Right); Exploratory laparotomy; and Tubal ligation.   Her family history includes Cancer in her father; Heart disease in her mother; Neurofibromatosis in her brother; Sickle cell trait in her brother.She reports that she has been smoking Cigarettes.  She has been smoking about 0.20 packs per day. She has never used smokeless tobacco. She reports that she does not drink alcohol or use drugs.    ToxAssure Select 13  Date Value Ref Range Status  04/10/2016 FINAL  Final    Comment:    ==================================================================== TOXASSURE SELECT 13 (MW) ==================================================================== Test                             Result       Flag       Units Drug Absent but Declared for Prescription Verification   Oxycodone                      Not Detected UNEXPECTED ng/mg creat ==================================================================== Test                      Result    Flag   Units      Ref Range   Creatinine              194              mg/dL      >=20 ==================================================================== Declared Medications:  The flagging and interpretation on this report are based on the  following declared medications.  Unexpected results may  arise from  inaccuracies in the declared medications.  **Note: The testing scope of this panel includes these medications:  Oxycodone (Oxy IR)  **Note: The testing scope of this panel does not include following  reported medications:  Gabapentin (Neurontin)  Levetiracetam (Keppra)  Nortriptyline (Pamelor) ==================================================================== For clinical consultation, please call [phone]. ====================================================================     Outpatient Medications Prior to Visit  Medication Sig Dispense Refill  . gabapentin (NEURONTIN) 100 MG  capsule Take 300 mg by mouth 3 (three) times daily.    Marland Kitchen levETIRAcetam (KEPPRA) 500 MG tablet Take 1,500 mg by mouth 2 (two) times daily.     . nortriptyline (PAMELOR) 10 MG capsule Take 20 mg by mouth at bedtime.     . topiramate (TOPAMAX) 15 MG capsule Take 15 mg by mouth at bedtime.     Facility-Administered Medications Prior to Visit  Medication Dose Route Frequency Provider Last Rate Last Dose  . bupivacaine (PF) (MARCAINE) 0.25 % injection 30 mL  30 mL Other Once Mohammed Kindle, MD      . fentaNYL (SUBLIMAZE) injection 100 mcg  100 mcg Intravenous Once Mohammed Kindle, MD      . lactated ringers infusion 1,000 mL  1,000 mL Intravenous Continuous Mohammed Kindle, MD      . lactated ringers infusion 1,000 mL  1,000 mL Intravenous Continuous Mohammed Kindle, MD 125 mL/hr at 05/01/16 1037 1,000 mL at 05/01/16 1037  . midazolam (VERSED) 5 MG/5ML injection 5 mg  5 mg Intravenous Once Mohammed Kindle, MD      . sodium chloride flush (NS) 0.9 % injection 20 mL  20 mL Other Once Mohammed Kindle, MD      . sodium chloride flush (NS) 0.9 % injection 20 mL  20 mL Other Once Mohammed Kindle, MD      . triamcinolone acetonide (KENALOG-40) injection 40 mg  40 mg Other Once Mohammed Kindle, MD       Lab Results  Component Value Date   WBC 6.0 11/02/2015   HGB 11.5 (L) 11/02/2015   HCT 36.7 11/02/2015   PLT 214 11/02/2015   GLUCOSE 106 (H) 11/02/2015   ALT 13 (L) 11/02/2015   AST 14 (L) 11/02/2015   NA 136 11/02/2015   K 3.9 11/02/2015   CL 105 11/02/2015   CREATININE 0.66 11/02/2015   BUN 7 11/02/2015   CO2 25 11/02/2015    --------------------------------------------------------------------------------------------------------------------- Mr Knee Right Wo Contrast  Result Date: 12/08/2015 CLINICAL DATA:  Chronic progressive diffuse right knee pain but primarily anterior. Painful range of motion. Popping. EXAM: MRI OF THE RIGHT KNEE WITHOUT CONTRAST TECHNIQUE: Multiplanar, multisequence MR imaging  of the knee was performed. No intravenous contrast was administered. COMPARISON:  Lower leg radiographs dated 07/28/2005 FINDINGS: MENISCI Medial meniscus:  Normal. Lateral meniscus:  Normal. LIGAMENTS Cruciates:  Normal. Collaterals:  Normal. CARTILAGE Patellofemoral:  Normal. Medial:  Normal. Lateral:  Normal. Joint:  Normal. Popliteal Fossa:  Normal. Extensor Mechanism:  Normal. Bones:  Normal. IMPRESSION: Normal MRI of the right knee. Electronically Signed   By: Lorriane Shire M.D.   On: 12/08/2015 13:52       ---------------------------------------------------------------------------------------------------------------------- Past Medical History:  Diagnosis Date  . Allergy   . Anemia   . GERD (gastroesophageal reflux disease)   . 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 Seizures ([place])     Past Surgical History:  Procedure Laterality Date  . BACK SURGERY     tumor removed  . BREAST SURGERY Right    benign tumor  removed  . CHOLECYSTECTOMY    . EXPLORATORY LAPAROTOMY    . FRACTURE SURGERY Right    ankle  . HAND SURGERY Right    tumor removed  . OTHER SURGICAL HISTORY Right    arm (3 tumors removed)  . TUBAL LIGATION      Family History  Problem Relation Age of Onset  . Heart disease Mother   . Cancer Father   . Neurofibromatosis Brother   . Sickle cell trait Brother     Social History  Substance Use Topics  . Smoking status: Current Every Day Smoker    Packs/day: 0.20    Types: Cigarettes  . Smokeless tobacco: Never Used  . Alcohol use No    ---------------------------------------------------------------------------------------------------------------------  BP 126/85   Pulse 82   Temp 98.3 F (36.8 C) (Oral)   Resp 18   Ht 5\' 5"  (1.651 m)   Wt 185 lb (83.9 kg)   LMP 01/31/2017   SpO2 100%   BMI 30.79 kg/m    BP Readings from Last 3 Encounters:  03/14/17 126/85  02/28/17 129/80  05/01/16 118/70     Wt Readings from Last 3 Encounters:   03/14/17 185 lb (83.9 kg)  02/28/17 180 lb (81.6 kg)  05/01/16 160 lb (72.6 kg)     ----------------------------------------------------------------------------------------------------------------------  ROS Review of Systems  Cardiac: No angina Neurologic: History of seizure disorder and as above Psychological: No suicidal or homicidal ideation and no history of drug abuse GI: Occasional reflux  Objective:  BP 126/85   Pulse 82   Temp 98.3 F (36.8 C) (Oral)   Resp 18   Ht 5\' 5"  (1.651 m)   Wt 185 lb (83.9 kg)   LMP 01/31/2017   SpO2 100%   BMI 30.79 kg/m   Physical Exam  Patient is alert oriented cooperative compliant and a good historian Pupils are equally round reactive to light extraocular muscles intact Heart is regular rate and rhythm without murmur Lungs are clear to auscultation Abdomen deferred Lumbar region reveals some mild pain in the paraspinous muscle and a well-healed scar right side over the SI region from previous neurofibroma resection. Gen. Exam also reveals multiple small neurofibromas over the abdomen lumbar region and upper chest and arms.She has some mild tenderness with palpation over the greater occipital notch bilaterally precipitating the tension headaches and tenderness that she describeshe ambulates well.     Assessment & Plan:   Valerie Mckinney was seen today for headache.  Diagnoses and all orders for this visit:  Bilateral occipital neuralgia  Primary osteoarthritis of both hips  Primary osteoarthritis of both knees  Sacroiliac joint dysfunction  Neurofibromatosis ([place])  Chronic pain syndrome  Other orders -     oxyCODONE (ROXICODONE) 5 MG immediate release tablet; Take 1 tablet (5 mg total) by mouth 2 (two) times daily.     ----------------------------------------------------------------------------------------------------------------------  Problem List Items Addressed This Visit      Nervous and Auditory    Neurofibromatosis (HCC)     Musculoskeletal and Integument   DJD (degenerative joint disease) of knee   Relevant Medications   oxyCODONE (ROXICODONE) 5 MG immediate release tablet   Sacroiliac joint dysfunction     Other   Bilateral occipital neuralgia - Primary   Relevant Medications   oxyCODONE (ROXICODONE) 5 MG immediate release tablet    Other Visit Diagnoses    Primary osteoarthritis of both hips       Chronic pain syndrome          ----------------------------------------------------------------------------------------------------------------------  1. Bilateral occipital neuralgia We had initially planned to perform a greater occipital nerve block today. This was refused by insurance but we have reiterated a request for this. She has responded favorably to these in the past and they've given her good relief and longer term pain relief allowing her to use less medication for pain management.  2. Migraine with aura and without status migrainosus, not intractable As above and continue follow-up with her neurologist.  3. Primary osteoarthritis of both hips   4. Primary osteoarthritis of both knees   5. Sacroiliac joint dysfunction   6. Neurofibromatosis ([place]) 7. Chronic pain syndrome We will start her on 5 mg oxycodone tablets at 45 per month which will allow her 1 tablet per day or twice a day dosing on days where she has more severe pain. She is to return to clinic in 1 month for reevaluation.   ----------------------------------------------------------------------------------------------------------------------  I have discontinued Valerie Mckinney's oxyCODONE. I am also having her start on oxyCODONE. Additionally, I am having her maintain her levETIRAcetam, nortriptyline, gabapentin, and topiramate. We will continue to administer bupivacaine (PF), triamcinolone acetonide, sodium chloride flush, sodium chloride flush, fentaNYL, lactated ringers, midazolam, and lactated  ringers.   Meds ordered this encounter  Medications  . oxyCODONE (ROXICODONE) 5 MG immediate release tablet    Sig: Take 1 tablet (5 mg total) by mouth 2 (two) times daily.    Dispense:  45 tablet    Refill:  0       Follow-up: Return in about 1 month (around 04/14/2017) for procedure.    Molli Barrows, MD 10:18 AM  The [place] practitioner database for opioid medications on this patient has been reviewed by me and my staff   Greater than 50% of the total encounter time was spent in counseling and / or coordination of care.     This dictation was performed utilizing Systems analyst.  Please excuse any unintentional or mistaken typographical errors as a result.

## 2017-03-30 ENCOUNTER — Emergency Department: Payer: Medicare Other

## 2017-03-30 ENCOUNTER — Encounter: Payer: Self-pay | Admitting: Emergency Medicine

## 2017-03-30 ENCOUNTER — Emergency Department
Admission: EM | Admit: 2017-03-30 | Discharge: 2017-03-30 | Disposition: A | Discharge status: Home / Self Care | Payer: Medicare Other | Attending: Emergency Medicine | Admitting: Emergency Medicine

## 2017-03-30 DIAGNOSIS — Y999 Unspecified external cause status: Secondary | ICD-10-CM | POA: Insufficient documentation

## 2017-03-30 DIAGNOSIS — S92524A Nondisplaced fracture of medial phalanx of right lesser toe(s), initial encounter for closed fracture: Secondary | ICD-10-CM | POA: Diagnosis not present

## 2017-03-30 DIAGNOSIS — Z79899 Other long term (current) drug therapy: Secondary | ICD-10-CM | POA: Diagnosis not present

## 2017-03-30 DIAGNOSIS — S99921A Unspecified injury of right foot, initial encounter: Secondary | ICD-10-CM | POA: Diagnosis present

## 2017-03-30 DIAGNOSIS — Y929 Unspecified place or not applicable: Secondary | ICD-10-CM | POA: Insufficient documentation

## 2017-03-30 DIAGNOSIS — S92501A Displaced unspecified fracture of right lesser toe(s), initial encounter for closed fracture: Secondary | ICD-10-CM

## 2017-03-30 DIAGNOSIS — F17210 Nicotine dependence, cigarettes, uncomplicated: Secondary | ICD-10-CM | POA: Diagnosis not present

## 2017-03-30 DIAGNOSIS — W1849XA Other slipping, tripping and stumbling without falling, initial encounter: Secondary | ICD-10-CM | POA: Diagnosis not present

## 2017-03-30 DIAGNOSIS — Y939 Activity, unspecified: Secondary | ICD-10-CM | POA: Diagnosis not present

## 2017-03-30 MED ORDER — OXYCODONE-ACETAMINOPHEN 5-325 MG PO TABS
ORAL_TABLET | ORAL | Status: AC
  Filled 2017-03-30: qty 1

## 2017-03-30 MED ORDER — OXYCODONE HCL 5 MG PO TABS: 5.0000 mg | ORAL_TABLET | Freq: Four times a day (QID) | ORAL | 0 refills | Status: DC | PRN

## 2017-03-30 MED ORDER — IBUPROFEN 600 MG PO TABS: 600.0000 mg | ORAL_TABLET | Freq: Four times a day (QID) | ORAL | 0 refills | Status: DC | PRN

## 2017-03-30 MED ORDER — OXYCODONE HCL 5 MG PO TABS
5.0000 mg | ORAL_TABLET | Freq: Once | ORAL | Status: AC
  Administered 2017-03-30: 5 mg via ORAL
  Filled 2017-03-30: qty 1

## 2017-03-30 MED ORDER — TRAMADOL HCL 50 MG PO TABS
50.0000 mg | ORAL_TABLET | Freq: Once | ORAL | Status: DC
  Filled 2017-03-30: qty 1

## 2017-03-30 MED ORDER — IBUPROFEN 600 MG PO TABS
600.0000 mg | ORAL_TABLET | Freq: Once | ORAL | Status: AC
  Administered 2017-03-30: 600 mg via ORAL
  Filled 2017-03-30: qty 1

## 2017-03-30 NOTE — ED Triage Notes (Signed)
Patient presents to ED via POV from home after tripping over cement paver in her yard. Patient c/o pain to top of right foot.

## 2017-03-30 NOTE — ED Provider Notes (Signed)
[facility] Emergency Department Provider Note   ____________________________________________   First MD Initiated Contact with Patient 03/30/17 1455     (approximate)  I have reviewed the triage vital signs and the nursing notes.   HISTORY  Chief Complaint Foot Pain    HPI Valerie Mckinney is a 36 y.o. female patient complaining of right foot pain after tripping over a cement paver in her yard. Patient rates the pain as a 10 over 10. Patient described a pain as "achy". No palliative measures prior to arrival.Patient has internal fixations bimalleolus  secondary to previous injury.   Past Medical History:  Diagnosis Date  . Allergy   . Anemia   . GERD (gastroesophageal reflux disease)   . 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 Seizures [facility])     Patient Active Problem List   Diagnosis Date Noted  . DJD (degenerative joint disease) of knee 11/16/2015  . Neurofibromatosis ([place]) 09/16/2015  . 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 Bilateral occipital neuralgia 09/16/2015  . Migraine 09/16/2015    Past Surgical History:  Procedure Laterality Date  . BACK SURGERY     tumor removed  . BREAST SURGERY Right    benign tumor removed  . CHOLECYSTECTOMY    . EXPLORATORY LAPAROTOMY    . FRACTURE SURGERY Right    ankle  . HAND SURGERY Right    tumor removed  . OTHER SURGICAL HISTORY Right    arm (3 tumors removed)  . TUBAL LIGATION      Prior to Admission medications   Medication Sig Start Date End Date Taking? Authorizing Provider  gabapentin (NEURONTIN) 100 MG capsule Take 300 mg by mouth 3 (three) times daily.    [provider]  ibuprofen (ADVIL,MOTRIN) 600 MG tablet Take 1 tablet (600 mg total) by mouth every 6 (six) hours as needed. 03/30/17   Sable Feil, PA-C  levETIRAcetam (KEPPRA) 500 MG tablet Take 1,500 mg by mouth 2 (two) times daily.     [provider]  nortriptyline (PAMELOR) 10 MG capsule Take  20 mg by mouth at bedtime.  08/18/15   [provider]  oxyCODONE (ROXICODONE) 5 MG immediate release tablet Take 1 tablet (5 mg total) by mouth every 6 (six) hours as needed for severe pain. 03/30/17   Sable Feil, PA-C  topiramate (TOPAMAX) 15 MG capsule Take 15 mg by mouth at bedtime.    [provider]    Allergies   Family History  Problem Relation Age of Onset  . Heart disease Mother   . Cancer Father   . Neurofibromatosis Brother   . Sickle cell trait Brother     Social History Social History  Substance Use Topics  . Smoking status: Current Every Day Smoker    Packs/day: 0.20    Types: Cigarettes  . Smokeless tobacco: Never Used  . Alcohol use No    Review of Systems  Constitutional: No fever/chills Eyes: No visual changes. ENT: No sore throat. Cardiovascular: Denies chest pain. Respiratory: Denies shortness of breath. Gastrointestinal: No abdominal pain.  No nausea, no vomiting.  No diarrhea.  No constipation. Genitourinary: Negative for dysuria. Musculoskeletal:Right fifth toe pain Skin: Negative for rash. Neurological: Negative for headaches, focal weakness or numbness. History of seizures. Allergic/Immunilogical: Tylenol ____________________________________________   PHYSICAL EXAM:  VITAL SIGNS: ED Triage Vitals  Enc Vitals Group     BP 03/30/17 1410 123/81     Pulse Rate 03/30/17 1410 82  Resp 03/30/17 1410 16     Temp 03/30/17 1410 99.1 F (37.3 C)     Temp Source 03/30/17 1410 Oral     SpO2 03/30/17 1410 100 %     Weight 03/30/17 1402 192 lb (87.1 kg)     Height 03/30/17 1402 5\' 5"  (1.651 m)     Head Circumference --      Peak Flow --      Pain Score 03/30/17 1402 10     Pain Loc --      Pain Edu? --      Excl. in [place]? --     Constitutional: Alert and oriented. Well appearing and in no acute distress. Cardiovascular: Normal rate, regular rhythm. Grossly normal heart sounds.  Good peripheral circulation. Respiratory:  Normal respiratory effort.  No retractions. Lungs CTAB. Musculoskeletal: No obvious deformity to the fifth digit right foot. Obvious edema. Moderate guarding palpation of the fifth digit right foot.  Neurologic:  Normal speech and language. No gross focal neurologic deficits are appreciated. No gait instability. Skin:  Skin is warm, dry and intact. No rash noted. Psychiatric: Mood and affect are normal. Speech and behavior are normal.  ____________________________________________   LABS (all labs ordered are listed, but only abnormal results are displayed)  Labs Reviewed - No data to display ____________________________________________  EKG   ____________________________________________  RADIOLOGY  Dg Foot Complete Right  Result Date: 03/30/2017 CLINICAL DATA:  Patient hit foot on concrete stone today. Pain along the third and fifth toes. EXAM: RIGHT FOOT COMPLETE - 3+ VIEW COMPARISON:  None. FINDINGS: Acute, closed, nondisplaced intra-articular fracture involving the right fifth middle phalanx extending into the DIP joint. Adjacent soft tissue swelling is noted. Partially imaged fixation hardware along the distal tibia and fibula with posttraumatic healed deformities of the distal tibial and fibular diaphysis. Small dorsal and tiny plantar calcaneal enthesophytes. Accessory ossicle seen adjacent to the cuboid. IMPRESSION: 1. Acute, closed, nondisplaced intra-articular fracture involving the right fifth middle phalanx extending into the DIP joint. Associated soft tissue swelling. 2. No joint dislocations. 3. Status post ORIF of the distal tibia and fibula. 4. Tiny calcaneal enthesophytes. Electronically Signed   By: Ashley Royalty M.D.   On: 03/30/2017 14:29    ____________________________________________   PROCEDURES  Procedure(s) performed: None  Procedures  Critical Care performed: No  ____________________________________________   INITIAL IMPRESSION / ASSESSMENT AND PLAN / ED  COURSE  Pertinent labs & imaging results that were available during my care of the patient were reviewed by me and considered in my medical decision making (see chart for details).  Right fifth toe fracture. Discussed x-ray finding with patient. Patient toe was buddy taped and placed an open shoe prior to departure. Patient given discharge care instructions and advised follow-up with family doctor      ____________________________________________   FINAL CLINICAL IMPRESSION(S) / ED DIAGNOSES  Final diagnoses:  Closed fracture of phalanx of right fifth toe, initial encounter      NEW MEDICATIONS STARTED DURING THIS VISIT:  New Prescriptions   IBUPROFEN (ADVIL,MOTRIN) 600 MG TABLET    Take 1 tablet (600 mg total) by mouth every 6 (six) hours as needed.     Note:  This document was prepared using Dragon voice recognition software and may include unintentional dictation errors.    Sable Feil, PA-C 03/30/17 1507    Harvest Dark, MD 04/02/17 1350

## 2017-03-30 NOTE — Discharge Instructions (Signed)
Were open shoe and keep toe taped as directed.  Dictated toe

## 2017-03-30 NOTE — ED Notes (Signed)

## 2017-04-20 ENCOUNTER — Encounter: Payer: Self-pay | Admitting: Anesthesiology

## 2017-04-20 ENCOUNTER — Ambulatory Visit: Payer: Medicare Other | Attending: Anesthesiology | Admitting: Anesthesiology

## 2017-04-20 VITALS — BP 121/87 | HR 93 | Temp 98.8°F | Resp 16 | Ht 65.0 in | Wt 198.0 lb

## 2017-04-20 DIAGNOSIS — M170 Bilateral primary osteoarthritis of knee: Secondary | ICD-10-CM

## 2017-04-20 DIAGNOSIS — R569 Unspecified convulsions: Secondary | ICD-10-CM | POA: Diagnosis not present

## 2017-04-20 DIAGNOSIS — M25512 Pain in left shoulder: Secondary | ICD-10-CM | POA: Insufficient documentation

## 2017-04-20 DIAGNOSIS — Z809 Family history of malignant neoplasm, unspecified: Secondary | ICD-10-CM | POA: Diagnosis not present

## 2017-04-20 DIAGNOSIS — Z79891 Long term (current) use of opiate analgesic: Secondary | ICD-10-CM | POA: Insufficient documentation

## 2017-04-20 DIAGNOSIS — G894 Chronic pain syndrome: Secondary | ICD-10-CM | POA: Insufficient documentation

## 2017-04-20 DIAGNOSIS — M25511 Pain in right shoulder: Secondary | ICD-10-CM | POA: Insufficient documentation

## 2017-04-20 DIAGNOSIS — Z9851 Tubal ligation status: Secondary | ICD-10-CM | POA: Diagnosis not present

## 2017-04-20 DIAGNOSIS — M160 Bilateral primary osteoarthritis of hip: Secondary | ICD-10-CM | POA: Diagnosis not present

## 2017-04-20 DIAGNOSIS — Q8500 Neurofibromatosis, unspecified: Secondary | ICD-10-CM | POA: Insufficient documentation

## 2017-04-20 DIAGNOSIS — Z9889 Other specified postprocedural states: Secondary | ICD-10-CM | POA: Insufficient documentation

## 2017-04-20 DIAGNOSIS — Z9049 Acquired absence of other specified parts of digestive tract: Secondary | ICD-10-CM | POA: Insufficient documentation

## 2017-04-20 DIAGNOSIS — M542 Cervicalgia: Secondary | ICD-10-CM | POA: Insufficient documentation

## 2017-04-20 DIAGNOSIS — D649 Anemia, unspecified: Secondary | ICD-10-CM | POA: Diagnosis not present

## 2017-04-20 DIAGNOSIS — M533 Sacrococcygeal disorders, not elsewhere classified: Secondary | ICD-10-CM | POA: Diagnosis not present

## 2017-04-20 DIAGNOSIS — Z8249 Family history of ischemic heart disease and other diseases of the circulatory system: Secondary | ICD-10-CM | POA: Insufficient documentation

## 2017-04-20 DIAGNOSIS — F17210 Nicotine dependence, cigarettes, uncomplicated: Secondary | ICD-10-CM | POA: Insufficient documentation

## 2017-04-20 DIAGNOSIS — R51 Headache: Secondary | ICD-10-CM | POA: Diagnosis present

## 2017-04-20 DIAGNOSIS — M5481 Occipital neuralgia: Secondary | ICD-10-CM | POA: Diagnosis not present

## 2017-04-20 DIAGNOSIS — G43109 Migraine with aura, not intractable, without status migrainosus: Secondary | ICD-10-CM | POA: Diagnosis not present

## 2017-04-20 DIAGNOSIS — Z79899 Other long term (current) drug therapy: Secondary | ICD-10-CM | POA: Insufficient documentation

## 2017-04-20 MED ORDER — OXYCODONE HCL 5 MG PO TABS: 5.0000 mg | ORAL_TABLET | ORAL | 0 refills | Status: DC | PRN

## 2017-04-20 NOTE — Progress Notes (Signed)
Safety precautions to be maintained throughout the outpatient stay will include: orient to surroundings, keep bed in low position, maintain call bell within reach at all times, provide assistance with transfer out of bed and ambulation.   Patient did not bring bottle, states they were empty.  Patient educated on bringing pills for count on her next visit.  She verbalizes u/o information.

## 2017-04-20 NOTE — Progress Notes (Signed)
Subjective:  Patient ID: Valerie Mckinney, female    DOB: [birth year]  Age: 36 y.o. MRN: [id number]  CC: Headache; Shoulder Pain (bilateral); and Back Pain (lower)   Service Provided on Last Visit: Evaluation PROCEDURE:none  HPI Valerie Mckinney continues to do well with her current regimen. She still reports having some cervical neck pain and occipital headaches but these have been reasonably stable. No other exacerbation to this is been noted. No other change in symptoms is been noted. She's taking her medications as prescribed generally utilizing oxycodone 5 tablets 1 tablet per day sometimes twice a day but no more than 45 tablets per month. This keeps her headaches and diffuse shoulder and neck pain under control. It also helps with her back pain. In review of her narcotic assessment sheet she has been stable with indications a good overall improvement in lifestyle function with the medications. She denies any diverting or illicit use with them as well.  By history she has left and right shoulder pain breast and low back pain of a chronic nature. This is a dull aching pain that is present frequently throughout the day described as heavy aching and annoying shooting and throbbing. The pain does not appear to be influenced by time of daybut is worse with climbing lifting kneeling standing squat and better with warm compresses medication management and hot packs. In the past she has had oxycodone 5 mg tablets which are given her good relief per Dr. Gurney Maxin. He has requested that we evaluate her and assist with medication management as well. She has reportedly failed conservative management and physical therapy and with her history of neurofibromatosis this plays into her issues she states.  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   She has a  past surgical history that includes Cholecystectomy; Fracture surgery (Right); Back surgery; Hand surgery (Right); Other surgical history (Right); Breast surgery (Right); Exploratory laparotomy; and Tubal ligation.   Her family history includes Cancer in her father; Heart disease in her mother; Neurofibromatosis in her brother; Sickle cell trait in her brother.She reports that she has been smoking Cigarettes.  She has been smoking about 0.20 packs per day. She has never used smokeless tobacco. She reports that she does not drink alcohol or use drugs.    [facility] Select 13  Date Value Ref Range Status  04/10/2016 FINAL  Final    Comment:    ==================================================================== TOX[facility] SELECT 13 (MW) ==================================================================== Test                             Result       Flag       Units Drug Absent but Declared for Prescription Verification   Oxycodone                      Not Detected UNEXPECTED ng/mg creat ==================================================================== Test                      Result    Flag   Units      Ref Range   Creatinine              194              mg/dL      >=20 ==================================================================== Declared Medications:  The flagging and  interpretation on this report are based on the  following declared medications.  Unexpected results may arise from  inaccuracies in the declared medications.  **Note: The testing scope of this panel includes these medications:  Oxycodone (Oxy IR)  **Note: The testing scope of this panel does not include following  reported medications:  Gabapentin (Neurontin)  Levetiracetam (Keppra)  Nortriptyline (Pamelor) ==================================================================== For clinical consultation, please call [phone]. ====================================================================      Outpatient Medications Prior to Visit  Medication Sig Dispense Refill  . gabapentin (NEURONTIN) 100 MG capsule Take 300 mg by mouth 3 (three) times daily.    Marland Kitchen ibuprofen (ADVIL,MOTRIN) 600 MG tablet Take 1 tablet (600 mg total) by mouth every 6 (six) hours as needed. 30 tablet 0  . levETIRAcetam (KEPPRA) 500 MG tablet Take 1,500 mg by mouth 2 (two) times daily.     . nortriptyline (PAMELOR) 10 MG capsule Take 20 mg by mouth at bedtime.     Marland Kitchen oxyCODONE (ROXICODONE) 5 MG immediate release tablet Take 1 tablet (5 mg total) by mouth every 6 (six) hours as needed for severe pain. 20 tablet 0  . topiramate (TOPAMAX) 15 MG capsule Take 15 mg by mouth at bedtime.     Facility-Administered Medications Prior to Visit  Medication Dose Route Frequency Provider Last Rate Last Dose  . bupivacaine (PF) (MARCAINE) 0.25 % injection 30 mL  30 mL Other Once Mohammed Kindle, MD      . lactated ringers infusion 1,000 mL  1,000 mL Intravenous Continuous Mohammed Kindle, MD      . lactated ringers infusion 1,000 mL  1,000 mL Intravenous Continuous Mohammed Kindle, MD 125 mL/hr at 05/01/16 1037 1,000 mL at 05/01/16 1037  . midazolam (VERSED) 5 MG/5ML injection 5 mg  5 mg Intravenous Once Mohammed Kindle, MD      . sodium chloride flush (NS) 0.9 % injection 20 mL  20 mL Other Once Mohammed Kindle, MD      . sodium chloride flush (NS) 0.9 % injection 20 mL  20 mL Other Once Mohammed Kindle, MD      . triamcinolone acetonide (KENALOG-40) injection 40 mg  40 mg Other Once Mohammed Kindle, MD      . fentaNYL (SUBLIMAZE) injection 100 mcg  100 mcg Intravenous Once Mohammed Kindle, MD       Lab Results  Component Value Date   WBC 6.0 11/02/2015   HGB 11.5 (L) 11/02/2015   HCT 36.7 11/02/2015   PLT 214 11/02/2015   GLUCOSE 106 (H) 11/02/2015   ALT 13 (L) 11/02/2015   AST 14 (L) 11/02/2015   NA 136 11/02/2015   K 3.9 11/02/2015   CL 105 11/02/2015   CREATININE 0.66 11/02/2015   BUN 7 11/02/2015   CO2 25 11/02/2015     --------------------------------------------------------------------------------------------------------------------- Mr Knee Right Wo Contrast  Result Date: 12/08/2015 CLINICAL DATA:  Chronic progressive diffuse right knee pain but primarily anterior. Painful range of motion. Popping. EXAM: MRI OF THE RIGHT KNEE WITHOUT CONTRAST TECHNIQUE: Multiplanar, multisequence MR imaging of the knee was performed. No intravenous contrast was administered. COMPARISON:  Lower leg radiographs dated 07/28/2005 FINDINGS: MENISCI Medial meniscus:  Normal. Lateral meniscus:  Normal. LIGAMENTS Cruciates:  Normal. Collaterals:  Normal. CARTILAGE Patellofemoral:  Normal. Medial:  Normal. Lateral:  Normal. Joint:  Normal. Popliteal Fossa:  Normal. Extensor Mechanism:  Normal. Bones:  Normal. IMPRESSION: Normal MRI of the right knee. Electronically Signed   By: Lorriane Shire M.D.   On: 12/08/2015 13:52       ----------------------------------------------------------------------------------------------------------------------  Past Medical History:  Diagnosis Date  . Allergy   . Anemia   . Broken toe 04/2016   broke outer toes on right foot, went to ED and is wearing a boot  . GERD (gastroesophageal reflux disease)   . 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 Seizures ([place])     Past Surgical History:  Procedure Laterality Date  . BACK SURGERY     tumor removed  . BREAST SURGERY Right    benign tumor removed  . CHOLECYSTECTOMY    . EXPLORATORY LAPAROTOMY    . FRACTURE SURGERY Right    ankle  . HAND SURGERY Right    tumor removed  . OTHER SURGICAL HISTORY Right    arm (3 tumors removed)  . TUBAL LIGATION      Family History  Problem Relation Age of Onset  . Heart disease Mother   . Cancer Father   . Neurofibromatosis Brother   . Sickle cell trait Brother     Social History  Substance Use Topics  . Smoking status: Current Every Day Smoker    Packs/day: 0.20    Types: Cigarettes  .  Smokeless tobacco: Never Used  . Alcohol use No    ---------------------------------------------------------------------------------------------------------------------  BP 121/87 (BP Location: Left Arm, Patient Position: Sitting, Cuff Size: Normal)   Pulse 93   Temp 98.8 F (37.1 C) (Oral)   Resp 16   Ht 5\' 5"  (1.651 m)   Wt 198 lb (89.8 kg)   LMP 03/30/2017 (Exact Date)   SpO2 100%   BMI 32.95 kg/m    BP Readings from Last 3 Encounters:  04/20/17 121/87  03/30/17 123/81  03/14/17 126/85     Wt Readings from Last 3 Encounters:  04/20/17 198 lb (89.8 kg)  03/30/17 192 lb (87.1 kg)  03/14/17 185 lb (83.9 kg)     ----------------------------------------------------------------------------------------------------------------------  ROS Review of Systems  Cardiac: No angina Neurologic: History of seizure disorder and as above Psychological: No suicidal or homicidal ideation and no history of drug abuse GI: Occasional refluxBut no other changes noted  Objective:  BP 121/87 (BP Location: Left Arm, Patient Position: Sitting, Cuff Size: Normal)   Pulse 93   Temp 98.8 F (37.1 C) (Oral)   Resp 16   Ht 5\' 5"  (1.651 m)   Wt 198 lb (89.8 kg)   LMP 03/30/2017 (Exact Date)   SpO2 100%   BMI 32.95 kg/m   Physical Exam  Patient is alert oriented cooperative compliant and a good historian Pupils are equally round reactive to light extraocular muscles intact Heart is regular rate and rhythm without murmur Lungs are clear to auscultation Abdomen deferred Mild tenderness in the splenius capitis and trapezius muscles but good range of motion in the neck and preservation of good upper extremity strength    Assessment & Plan:   Valerie Mckinney was seen today for headache, shoulder pain and back pain.  Diagnoses and all orders for this visit:  Bilateral occipital neuralgia  Primary osteoarthritis of both hips  Primary osteoarthritis of both knees  Sacroiliac joint  dysfunction  Neurofibromatosis (HCC)  Chronic pain syndrome  Migraine with aura and without status migrainosus, not intractable  Other orders -     oxyCODONE (ROXICODONE) 5 MG immediate release tablet; Take 1 tablet (5 mg total) by mouth every 4 (four) hours as needed for severe pain.     ----------------------------------------------------------------------------------------------------------------------  Problem List Items Addressed This Visit      Cardiovascular and Mediastinum   Migraine  Relevant Medications   oxyCODONE (ROXICODONE) 5 MG immediate release tablet     Nervous and Auditory   Neurofibromatosis (HCC)     Musculoskeletal and Integument   DJD (degenerative joint disease) of knee   Relevant Medications   oxyCODONE (ROXICODONE) 5 MG immediate release tablet   Sacroiliac joint dysfunction     Other   Bilateral occipital neuralgia - Primary   Relevant Medications   oxyCODONE (ROXICODONE) 5 MG immediate release tablet    Other Visit Diagnoses    Primary osteoarthritis of both hips       Chronic pain syndrome          ----------------------------------------------------------------------------------------------------------------------  1. Bilateral occipital neuralgia We will continue to hold off on greater occipital nerve blocks for financial reasons as these have not been covered by her insurance.  Current symptoms appear to be stable. 2. Migraine with aura and without status migrainosus, not intractable As above and continue follow-up with her neurologist.  3. Primary osteoarthritis of both hips   4. Primary osteoarthritis of both knees   5. Sacroiliac joint dysfunction   6. Neurofibromatosis ([place]) 7. Chronic pain syndrome She continues to do well with her oxycodone regimen and is keeping this utilization to 45 tablets per month. Refills are given for July with return to clinic in 1  month.   ----------------------------------------------------------------------------------------------------------------------  I am having Valerie Mckinney start on oxyCODONE. I am also having her maintain her levETIRAcetam, nortriptyline, gabapentin, topiramate, oxyCODONE, and ibuprofen. We will stop administering fentaNYL. Additionally, we will continue to administer bupivacaine (PF), triamcinolone acetonide, sodium chloride flush, sodium chloride flush, lactated ringers, midazolam, and lactated ringers.   Meds ordered this encounter  Medications  . oxyCODONE (ROXICODONE) 5 MG immediate release tablet    Sig: Take 1 tablet (5 mg total) by mouth every 4 (four) hours as needed for severe pain.    Dispense:  45 tablet    Refill:  0       Follow-up: Return in about 1 month (around 05/21/2017) for evaluation, med refill.    Molli Barrows, MD 10:48 AM  The [place] practitioner database for opioid medications on this patient has been reviewed by me and my staff   Greater than 50% of the total encounter time was spent in counseling and / or coordination of care.     This dictation was performed utilizing Systems analyst.  Please excuse any unintentional or mistaken typographical errors as a result.

## 2017-05-08 ENCOUNTER — Ambulatory Visit: Payer: Medicare Other | Attending: Anesthesiology | Admitting: Anesthesiology

## 2017-05-08 ENCOUNTER — Encounter: Payer: Self-pay | Admitting: Anesthesiology

## 2017-05-08 VITALS — BP 121/84 | HR 72 | Temp 98.5°F | Resp 16 | Ht 65.0 in | Wt 195.0 lb

## 2017-05-08 DIAGNOSIS — M170 Bilateral primary osteoarthritis of knee: Secondary | ICD-10-CM | POA: Diagnosis not present

## 2017-05-08 DIAGNOSIS — Q8500 Neurofibromatosis, unspecified: Secondary | ICD-10-CM

## 2017-05-08 DIAGNOSIS — Z832 Family history of diseases of the blood and blood-forming organs and certain disorders involving the immune mechanism: Secondary | ICD-10-CM | POA: Insufficient documentation

## 2017-05-08 DIAGNOSIS — Z809 Family history of malignant neoplasm, unspecified: Secondary | ICD-10-CM | POA: Insufficient documentation

## 2017-05-08 DIAGNOSIS — Z8249 Family history of ischemic heart disease and other diseases of the circulatory system: Secondary | ICD-10-CM | POA: Diagnosis not present

## 2017-05-08 DIAGNOSIS — M5481 Occipital neuralgia: Secondary | ICD-10-CM

## 2017-05-08 DIAGNOSIS — Z79899 Other long term (current) drug therapy: Secondary | ICD-10-CM | POA: Diagnosis not present

## 2017-05-08 DIAGNOSIS — Z9889 Other specified postprocedural states: Secondary | ICD-10-CM | POA: Diagnosis not present

## 2017-05-08 DIAGNOSIS — M160 Bilateral primary osteoarthritis of hip: Secondary | ICD-10-CM

## 2017-05-08 DIAGNOSIS — F17210 Nicotine dependence, cigarettes, uncomplicated: Secondary | ICD-10-CM | POA: Diagnosis not present

## 2017-05-08 DIAGNOSIS — K219 Gastro-esophageal reflux disease without esophagitis: Secondary | ICD-10-CM | POA: Insufficient documentation

## 2017-05-08 DIAGNOSIS — R569 Unspecified convulsions: Secondary | ICD-10-CM | POA: Insufficient documentation

## 2017-05-08 DIAGNOSIS — Z9049 Acquired absence of other specified parts of digestive tract: Secondary | ICD-10-CM | POA: Diagnosis not present

## 2017-05-08 DIAGNOSIS — M549 Dorsalgia, unspecified: Secondary | ICD-10-CM | POA: Diagnosis not present

## 2017-05-08 DIAGNOSIS — M25512 Pain in left shoulder: Secondary | ICD-10-CM

## 2017-05-08 DIAGNOSIS — D649 Anemia, unspecified: Secondary | ICD-10-CM | POA: Diagnosis not present

## 2017-05-08 DIAGNOSIS — M25511 Pain in right shoulder: Secondary | ICD-10-CM | POA: Insufficient documentation

## 2017-05-08 DIAGNOSIS — G43109 Migraine with aura, not intractable, without status migrainosus: Secondary | ICD-10-CM | POA: Insufficient documentation

## 2017-05-08 DIAGNOSIS — G894 Chronic pain syndrome: Secondary | ICD-10-CM | POA: Insufficient documentation

## 2017-05-08 MED ORDER — OXYCODONE HCL 5 MG PO TABS: 5.0000 mg | ORAL_TABLET | Freq: Two times a day (BID) | ORAL | 0 refills | Status: DC

## 2017-05-08 NOTE — Progress Notes (Signed)
Nursing Pain Medication Assessment:  Safety precautions to be maintained throughout the outpatient stay will include: orient to surroundings, keep bed in low position, maintain call bell within reach at all times, provide assistance with transfer out of bed and ambulation.  Medication Inspection Compliance: Pill count conducted under aseptic conditions, in front of the patient. Neither the pills nor the bottle was removed from the patient's sight at any time. Once count was completed pills were immediately returned to the patient in their original bottle.  Medication: Oxycodone IR Pill/Patch Count: 3 of 45 pills remain Pill/Patch Appearance: Markings consistent with prescribed medication Bottle Appearance: Standard pharmacy container. Clearly labeled. Filled Date: 07/06 / 2018 Last Medication intake:  Yesterday

## 2017-05-10 ENCOUNTER — Ambulatory Visit: Payer: Medicare Other | Admitting: Anesthesiology

## 2017-05-10 NOTE — Progress Notes (Signed)
Subjective:  Patient ID: Valerie Mckinney, female    DOB: 07-Feb-1981  Age: 35 y.o. MRN: [id number]  CC: Headache; Shoulder Pain (left); Back Pain (lower); and Hip Pain (right)   Service Provided on Last Visit: Med Refill PROCEDURE:none  HPI Valerie Mckinney Presents for reevaluation. She was last seen approximately 3 weeks ago. The severity of her pain remains stable in nature. She has possible headaches with diffuse pain and low back pain. She has been taking her oxycodone generally averaging 1 or 1.5 tablets per day and these are well tolerated. We have reviewed the narcotic assessment sheet and she continues to derive good lifestyle benefit and function with her medications. No constipation other other untoward side effects are noted. We have reviewed her [facility] practitioner database inflammation and it is appropriate.  She describes a pain in the occiput region is primarily tension like with associated intermittent headaches that have been stable in nature. She continues to follow along with Dr. Melrose Nakayama for these. Her back pain remains primarily centralized withchronic daily painthat is aching and gnawing in nature and unrelieved by conservative therapy.  By history she has left and right shoulder pain breast and low back pain of a chronic nature. This is a dull aching pain that is present frequently throughout the day described as heavy aching and annoying shooting and throbbing. The pain does not appear to be influenced by time of daybut is worse with climbing lifting kneeling standing squat and better with warm compresses medication management and hot packs. In the past she has had oxycodone 5 mg tablets which are given her good relief per Dr. Gurney Maxin. He has requested that we evaluate her and assist with medication management as well. She has reportedly failed conservative management and physical therapy and with her history of neurofibromatosis this plays into her issues she  states.  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   She has a past surgical history that includes Cholecystectomy; Fracture surgery (Right); Back surgery; Hand surgery (Right); Other surgical history (Right); Breast surgery (Right); Exploratory laparotomy; and Tubal ligation.   Her family history includes Cancer in her father; Heart disease in her mother; Neurofibromatosis in her brother; Sickle cell trait in her brother.She reports that she has been smoking Cigarettes.  She has been smoking about 0.20 packs per day. She has never used smokeless tobacco. She reports that she does not drink alcohol or use drugs.    ToxAssure Select 13  Date Value Ref Range Status  04/10/2016 FINAL  Final    Comment:    ==================================================================== TOXASSURE SELECT 13 (MW) ==================================================================== Test                             Result       Flag       Units Drug Absent but Declared for Prescription Verification   Oxycodone                      Not Detected UNEXPECTED ng/mg creat ==================================================================== Test                      Result    Flag   Units      Ref Range   Creatinine  194              mg/dL      >=20 ==================================================================== Declared Medications:  The flagging and interpretation on this report are based on the  following declared medications.  Unexpected results may arise from  inaccuracies in the declared medications.  **Note: The testing scope of this panel includes these medications:  Oxycodone (Oxy IR)  **Note: The testing scope of this panel does not include following  reported medications:  Gabapentin (Neurontin)  Levetiracetam (Keppra)  Nortriptyline  (Pamelor) ==================================================================== For clinical consultation, please call [phone]. ====================================================================     Outpatient Medications Prior to Visit  Medication Sig Dispense Refill  . gabapentin (NEURONTIN) 100 MG capsule Take 300 mg by mouth 3 (three) times daily.    Marland Kitchen ibuprofen (ADVIL,MOTRIN) 600 MG tablet Take 1 tablet (600 mg total) by mouth every 6 (six) hours as needed. 30 tablet 0  . levETIRAcetam (KEPPRA) 500 MG tablet Take 1,500 mg by mouth 2 (two) times daily.     . nortriptyline (PAMELOR) 10 MG capsule Take 20 mg by mouth at bedtime.     . topiramate (TOPAMAX) 15 MG capsule Take 15 mg by mouth at bedtime.    Marland Kitchen oxyCODONE (ROXICODONE) 5 MG immediate release tablet Take 1 tablet (5 mg total) by mouth every 6 (six) hours as needed for severe pain. 20 tablet 0  . oxyCODONE (ROXICODONE) 5 MG immediate release tablet Take 1 tablet (5 mg total) by mouth every 4 (four) hours as needed for severe pain. 45 tablet 0   Facility-Administered Medications Prior to Visit  Medication Dose Route Frequency Provider Last Rate Last Dose  . bupivacaine (PF) (MARCAINE) 0.25 % injection 30 mL  30 mL Other Once Mohammed Kindle, MD      . lactated ringers infusion 1,000 mL  1,000 mL Intravenous Continuous Mohammed Kindle, MD      . lactated ringers infusion 1,000 mL  1,000 mL Intravenous Continuous Mohammed Kindle, MD 125 mL/hr at 05/01/16 1037 1,000 mL at 05/01/16 1037  . midazolam (VERSED) 5 MG/5ML injection 5 mg  5 mg Intravenous Once Mohammed Kindle, MD      . sodium chloride flush (NS) 0.9 % injection 20 mL  20 mL Other Once Mohammed Kindle, MD      . sodium chloride flush (NS) 0.9 % injection 20 mL  20 mL Other Once Mohammed Kindle, MD      . triamcinolone acetonide (KENALOG-40) injection 40 mg  40 mg Other Once Mohammed Kindle, MD       Lab Results  Component Value Date   WBC 6.0 11/02/2015   HGB 11.5 (L)  11/02/2015   HCT 36.7 11/02/2015   PLT 214 11/02/2015   GLUCOSE 106 (H) 11/02/2015   ALT 13 (L) 11/02/2015   AST 14 (L) 11/02/2015   NA 136 11/02/2015   K 3.9 11/02/2015   CL 105 11/02/2015   CREATININE 0.66 11/02/2015   BUN 7 11/02/2015   CO2 25 11/02/2015    --------------------------------------------------------------------------------------------------------------------- Mr Knee Right Wo Contrast  Result Date: 12/08/2015 CLINICAL DATA:  Chronic progressive diffuse right knee pain but primarily anterior. Painful range of motion. Popping. EXAM: MRI OF THE RIGHT KNEE WITHOUT CONTRAST TECHNIQUE: Multiplanar, multisequence MR imaging of the knee was performed. No intravenous contrast was administered. COMPARISON:  Lower leg radiographs dated 07/28/2005 FINDINGS: MENISCI Medial meniscus:  Normal. Lateral meniscus:  Normal. LIGAMENTS Cruciates:  Normal. Collaterals:  Normal. CARTILAGE Patellofemoral:  Normal. Medial:  Normal. Lateral:  Normal. Joint:  Normal. Popliteal Fossa:  Normal. Extensor Mechanism:  Normal. Bones:  Normal. IMPRESSION: Normal MRI of the right knee. Electronically Signed   By: Lorriane Shire M.D.   On: 12/08/2015 13:52       ---------------------------------------------------------------------------------------------------------------------- Past Medical History:  Diagnosis Date  . Allergy   . Anemia   . Broken toe 04/2016   broke outer toes on right foot, went to ED and is wearing a boot  . GERD (gastroesophageal reflux disease)   . 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 Seizures ([place])     Past Surgical History:  Procedure Laterality Date  . BACK SURGERY     tumor removed  . BREAST SURGERY Right    benign tumor removed  . CHOLECYSTECTOMY    . EXPLORATORY LAPAROTOMY    . FRACTURE SURGERY Right    ankle  . HAND SURGERY Right    tumor removed  . OTHER SURGICAL HISTORY Right    arm (3 tumors removed)  . TUBAL LIGATION      Family History   Problem Relation Age of Onset  . Heart disease Mother   . Cancer Father   . Neurofibromatosis Brother   . Sickle cell trait Brother     Social History  Substance Use Topics  . Smoking status: Current Every Day Smoker    Packs/day: 0.20    Types: Cigarettes  . Smokeless tobacco: Never Used  . Alcohol use No    ---------------------------------------------------------------------------------------------------------------------  BP 121/84   Pulse 72   Temp 98.5 F (36.9 C) (Oral)   Resp 16   Ht 5\' 5"  (1.651 m)   Wt 195 lb (88.5 kg)   LMP 04/29/2017   SpO2 100%   BMI 32.45 kg/m    BP Readings from Last 3 Encounters:  05/08/17 121/84  04/20/17 121/87  03/30/17 123/81     Wt Readings from Last 3 Encounters:  05/08/17 195 lb (88.5 kg)  04/20/17 198 lb (89.8 kg)  03/30/17 192 lb (87.1 kg)     ----------------------------------------------------------------------------------------------------------------------  ROS Review of Systems  GI: No constipation GU: No dysuria or urinary hesitancy Pulmonary no shortness of breath  Objective:  BP 121/84   Pulse 72   Temp 98.5 F (36.9 C) (Oral)   Resp 16   Ht 5\' 5"  (1.651 m)   Wt 195 lb (88.5 kg)   LMP 04/29/2017   SpO2 100%   BMI 32.45 kg/m   Physical Exam  Heart is regular rate and rhythm Lungs are clear to auscultation Inspection low back reveals some paraspinous muscle tenderness but no overt trigger points. Her muscle tone and bulk is good She still has tenderness in the hips and knees with pain on mobilization  Assessment & Plan:   Valerie Mckinney was seen today for headache, shoulder pain, back pain and hip pain.  Diagnoses and all orders for this visit:  Bilateral occipital neuralgia  Primary osteoarthritis of both hips  Primary osteoarthritis of both knees  Neurofibromatosis ([place])  Pain in joint of left shoulder  Other orders -     oxyCODONE (ROXICODONE) 5 MG immediate release tablet; Take 1  tablet (5 mg total) by mouth 2 (two) times daily.     ----------------------------------------------------------------------------------------------------------------------  Problem List Items Addressed This Visit      Nervous and Auditory   Neurofibromatosis (HCC)     Musculoskeletal and Integument   DJD (degenerative joint disease) of knee   Relevant Medications   oxyCODONE (ROXICODONE) 5 MG immediate release  tablet     Other   Bilateral occipital neuralgia - Primary   Relevant Medications   oxyCODONE (ROXICODONE) 5 MG immediate release tablet    Other Visit Diagnoses    Primary osteoarthritis of both hips       Pain in joint of left shoulder          ----------------------------------------------------------------------------------------------------------------------  1. Bilateral occipital neuralgia If she gains clearance for injection for her occipital neuralgia we will plan on doing these in the future. As of yet she does not have insurance approval. 2. Migraine with aura and without status migrainosus, not intractable As above and continue follow-up with her neurologist.  3. Primary osteoarthritis of both hips Continue current medication management with conservative therapy and I think her opioid dosing is appropriate. We will give refills for July and August with return to clinic in approximately 1 or 2 months.  4. Primary osteoarthritis of both knees   5. Sacroiliac joint dysfunction   6. Neurofibromatosis ([place]) 7. Chronic pain syndrome .   ----------------------------------------------------------------------------------------------------------------------  I have discontinued Ms. Rosa's oxyCODONE. I have also changed her oxyCODONE. Additionally, I am having her maintain her levETIRAcetam, nortriptyline, gabapentin, topiramate, and ibuprofen. We will continue to administer bupivacaine (PF), triamcinolone acetonide, sodium chloride flush, sodium  chloride flush, lactated ringers, midazolam, and lactated ringers.   Meds ordered this encounter  Medications  . oxyCODONE (ROXICODONE) 5 MG immediate release tablet    Sig: Take 1 tablet (5 mg total) by mouth 2 (two) times daily.    Dispense:  45 tablet    Refill:  0    Do not fill until [id number]       Follow-up: Return in about 1 month (around 06/08/2017) for evaluation, med refill.    Molli Barrows, MD 1:56 PM  The [place] practitioner database for opioid medications on this patient has been reviewed by me and my staff   Greater than 50% of the total encounter time was spent in counseling and / or coordination of care.     This dictation was performed utilizing Systems analyst.  Please excuse any unintentional or mistaken typographical errors as a result.

## 2017-06-07 ENCOUNTER — Ambulatory Visit: Payer: Medicare Other | Attending: Anesthesiology | Admitting: Anesthesiology

## 2017-06-07 VITALS — BP 117/92 | HR 99 | Temp 98.9°F | Resp 16

## 2017-06-07 DIAGNOSIS — F1190 Opioid use, unspecified, uncomplicated: Secondary | ICD-10-CM | POA: Diagnosis not present

## 2017-06-07 DIAGNOSIS — G894 Chronic pain syndrome: Secondary | ICD-10-CM | POA: Diagnosis not present

## 2017-06-07 DIAGNOSIS — M5481 Occipital neuralgia: Secondary | ICD-10-CM | POA: Diagnosis not present

## 2017-06-07 DIAGNOSIS — M170 Bilateral primary osteoarthritis of knee: Secondary | ICD-10-CM

## 2017-06-07 DIAGNOSIS — Q8500 Neurofibromatosis, unspecified: Secondary | ICD-10-CM | POA: Diagnosis not present

## 2017-06-07 MED ORDER — OXYCODONE HCL 5 MG PO TABS: 5.0000 mg | ORAL_TABLET | Freq: Two times a day (BID) | ORAL | 0 refills | Status: DC

## 2017-06-07 NOTE — Progress Notes (Signed)
Nursing Pain Medication Assessment:  Safety precautions to be maintained throughout the outpatient stay will include: orient to surroundings, keep bed in low position, maintain call bell within reach at all times, provide assistance with transfer out of bed and ambulation.  Medication Inspection Compliance: Pill count conducted under aseptic conditions, in front of the patient. Neither the pills nor the bottle was removed from the patient's sight at any time. Once count was completed pills were immediately returned to the patient in their original bottle.  Medication: Oxycodone IR Pill/Patch Count: 5 of 45 pills remain Pill/Patch Appearance: Markings consistent with prescribed medication Bottle Appearance: Standard pharmacy container. Clearly labeled. Filled Date: 08/05 / 2018 Last Medication intake:  Today

## 2017-06-07 NOTE — Patient Instructions (Signed)
Occipital Nerve Block Patient Information  Description: The occipital nerves originate in the cervical (neck) spinal cord and travel upward through muscle and tissue to supply sensation to the back of the head and top of the scalp.  In addition, the nerves control some of the muscles of the scalp.  Occipital neuralgia is an irritation of these nerves which can cause headaches, numbness of the scalp, and neck discomfort.     The occipital nerve block will interrupt nerve transmission through these nerves and can relieve pain and spasm.  The block consists of insertion of a small needle under the skin in the back of the head to deposit local anesthetic (numbing medicine) and/or steroids around the nerve.  The entire block usually lasts less than 5 minutes.  Conditions which may be treated by occipital blocks:   Muscular pain and spasm of the scalp  Nerve irritation, back of the head  Headaches  Upper neck pain  Preparation for the injection:  1. Do not eat any solid food or dairy products within 8 hours of your appointment. 2. You may drink clear liquids up to 3 hours before appointment.  Clear liquids include water, black coffee, juice or soda.  No milk or cream please. 3. You may take your regular medication, including pain medications, with a sip of water before you appointment.  Diabetics should hold regular insulin (if taken separately) and take 1/2 normal NPH dose the morning of the procedure.  Carry some sugar containing items with you to your appointment. 4. A driver must accompany you and be prepared to drive you home after your procedure. 5. Bring all your current medications with you. 6. An IV may be inserted and sedation may be given at the discretion of the physician. 7. A blood pressure cuff, EKG, and other monitors will often be applied during the procedure.  Some patients may need to have extra oxygen administered for a short period. 8. You will be asked to provide medical  information, including your allergies and medications, prior to the procedure.  We must know immediately if you are taking blood thinners (like Coumadin/Warfarin) or if you are allergic to IV iodine contrast (dye).  We must know if you could possible be pregnant.  9. Do not wear a high collared shirt or turtleneck.  Tie long hair up in the back if possible.  Possible side-effects:   Bleeding from needle site  Infection (rare, may require surgery)  Nerve injury (rare)  Hair on back of neck can be tinged with iodine scrub (this will wash out)  Light-headedness (temporary)  Pain at injection site (several days)  Decreased blood pressure (rare, temporary)  Seizure (very rare)  Call if you experience:   Hives or difficulty breathing ( go to the emergency room)  Inflammation or drainage at the injection site(s)  Please note:  Although the local anesthetic injected can often make your painful muscles or headache feel good for several hours after the injection, the pain may return.  It takes 3-7 days for steroids to work.  You may not notice any pain relief for at least one week.  If effective, we will often do a series of injections spaced 3-6 weeks apart to maximally decrease your pain.  If you have any questions, please call [phone] [place] were given one prescription for Oxycodone today.

## 2017-06-07 NOTE — Progress Notes (Signed)
Subjective:  Patient ID: Valerie Mckinney, female    DOB: 04-04-1981  Age: 36 y.o. MRN: [id number]  CC: Back Pain (lower) and Headache   HPI Valerie Mckinney presents for Reevaluation. She is last seen a month ago and has been doing reasonably well. The quality characteristic and distribution of her posterior tension headaches is been stable in nature. She primarily gets a gnawing aching pain with tightness and stiffness in the posterior neck that radiates in a bandlike distribution over the back of her scalp. Occasionally this initiates a migraine headache. The quality characteristic and distribution of these headaches is been stable in nature. She also has some neck stiffness and some shoulder stiffness. She is not doing any current exercises or stretching activities. She is taking oxycodone 5 mg tablets 1 or 2 a day to help with pain relief. She has had bilateral occipital nerve blocks in the past that it helped reduce the frequency and severity of her headaches however for insurance reasons she's been unable to reinitiate these. Otherwise she is in her usual state of health with no changes in upper or lower extremity strength or function.  Outpatient Medications Prior to Visit  Medication Sig Dispense Refill  . gabapentin (NEURONTIN) 100 MG capsule Take 300 mg by mouth 3 (three) times daily.    Marland Kitchen ibuprofen (ADVIL,MOTRIN) 600 MG tablet Take 1 tablet (600 mg total) by mouth every 6 (six) hours as needed. 30 tablet 0  . levETIRAcetam (KEPPRA) 500 MG tablet Take 1,500 mg by mouth 2 (two) times daily.     . nortriptyline (PAMELOR) 10 MG capsule Take 20 mg by mouth at bedtime.     . topiramate (TOPAMAX) 15 MG capsule Take 15 mg by mouth at bedtime.    Marland Kitchen oxyCODONE (ROXICODONE) 5 MG immediate release tablet Take 1 tablet (5 mg total) by mouth 2 (two) times daily. 45 tablet 0   Facility-Administered Medications Prior to Visit  Medication Dose Route Frequency Provider Last Rate Last Dose  .  bupivacaine (PF) (MARCAINE) 0.25 % injection 30 mL  30 mL Other Once Mohammed Kindle, MD      . lactated ringers infusion 1,000 mL  1,000 mL Intravenous Continuous Mohammed Kindle, MD      . lactated ringers infusion 1,000 mL  1,000 mL Intravenous Continuous Mohammed Kindle, MD 125 mL/hr at 05/01/16 1037 1,000 mL at 05/01/16 1037  . midazolam (VERSED) 5 MG/5ML injection 5 mg  5 mg Intravenous Once Mohammed Kindle, MD      . sodium chloride flush (NS) 0.9 % injection 20 mL  20 mL Other Once Mohammed Kindle, MD      . sodium chloride flush (NS) 0.9 % injection 20 mL  20 mL Other Once Mohammed Kindle, MD      . triamcinolone acetonide (KENALOG-40) injection 40 mg  40 mg Other Once Mohammed Kindle, MD       ROS Review of Systems  Objective:  BP (!) 117/92   Pulse 99   Temp 98.9 F (37.2 C) (Oral)   Resp 16   SpO2 100%    BP Readings from Last 3 Encounters:  06/07/17 (!) 117/92  05/08/17 121/84  04/20/17 121/87     Wt Readings from Last 3 Encounters:  05/08/17 195 lb (88.5 kg)  04/20/17 198 lb (89.8 kg)  03/30/17 192 lb (87.1 kg)   Review of systems: Cardiac: No angina or shortness of breath CNS: No drowsiness GI: No constipation  Physical Exam  PERRL  EOMI HEART IS RRR  LCTA MUSCULOSKELETAL she has some bilateral tenderness over the occipital notch. She has some tenderness in the bilateral trapezius muscles. Strength in the upper extremities appears to be intact range of motion at the atlantooccipital joint is intact  Labs  No results found for: HGBA1C Lab Results  Component Value Date   CREATININE 0.66 11/02/2015    -------------------------------------------------------------------------------------------------------------------- Lab Results  Component Value Date   WBC 6.0 11/02/2015   HGB 11.5 (L) 11/02/2015   HCT 36.7 11/02/2015   PLT 214 11/02/2015   GLUCOSE 106 (H) 11/02/2015   ALT 13 (L) 11/02/2015   AST 14 (L) 11/02/2015   NA 136 11/02/2015   K 3.9  11/02/2015   CL 105 11/02/2015   CREATININE 0.66 11/02/2015   BUN 7 11/02/2015   CO2 25 11/02/2015    --------------------------------------------------------------------------------------------------------------------- Dg Foot Complete Right  Result Date: 03/30/2017 CLINICAL DATA:  Patient hit foot on concrete stone today. Pain along the third and fifth toes. EXAM: RIGHT FOOT COMPLETE - 3+ VIEW COMPARISON:  None. FINDINGS: Acute, closed, nondisplaced intra-articular fracture involving the right fifth middle phalanx extending into the DIP joint. Adjacent soft tissue swelling is noted. Partially imaged fixation hardware along the distal tibia and fibula with posttraumatic healed deformities of the distal tibial and fibular diaphysis. Small dorsal and tiny plantar calcaneal enthesophytes. Accessory ossicle seen adjacent to the cuboid. IMPRESSION: 1. Acute, closed, nondisplaced intra-articular fracture involving the right fifth middle phalanx extending into the DIP joint. Associated soft tissue swelling. 2. No joint dislocations. 3. Status post ORIF of the distal tibia and fibula. 4. Tiny calcaneal enthesophytes. Electronically Signed   By: Ashley Royalty M.D.   On: 03/30/2017 14:29     Assessment & Plan:   Valerie Mckinney was seen today for back pain and headache.  Diagnoses and all orders for this visit:  Bilateral occipital neuralgia -     GREATER OCCIPITAL NERVE BLOCK; Future  Primary osteoarthritis of both knees  Neurofibromatosis (HCC)  Chronic pain syndrome  Chronic, continuous use of opioids  Other orders -     oxyCODONE (ROXICODONE) 5 MG immediate release tablet; Take 1 tablet (5 mg total) by mouth 2 (two) times daily.        ----------------------------------------------------------------------------------------------------------------------  Problem List Items Addressed This Visit      Nervous and Auditory   Neurofibromatosis (HCC)     Musculoskeletal and Integument   DJD  (degenerative joint disease) of knee   Relevant Medications   oxyCODONE (ROXICODONE) 5 MG immediate release tablet     Other   Bilateral occipital neuralgia - Primary   Relevant Medications   oxyCODONE (ROXICODONE) 5 MG immediate release tablet   Other Relevant Orders   GREATER OCCIPITAL NERVE BLOCK    Other Visit Diagnoses    Chronic pain syndrome       Chronic, continuous use of opioids            ----------------------------------------------------------------------------------------------------------------------  1. Bilateral occipital neuralgia We will plan on this at her next visit. The risks and benefits of an once again reviewed all questions answered. No guarantees were made. Hopefully these will reduce the frequency and severity of her posterior tension headaches. - GREATER OCCIPITAL NERVE BLOCK; Future  2. Primary osteoarthritis of both knees   3. Neurofibromatosis ([place])   4. Chronic pain syndrome Continue her current pain medications as prescribed. We have reviewed the [facility] practitioner database information and it is appropriate.  5. Chronic, continuous use of opioids  As above    ----------------------------------------------------------------------------------------------------------------------  I am having Valerie Mckinney maintain her levETIRAcetam, nortriptyline, gabapentin, topiramate, ibuprofen, and oxyCODONE. We will continue to administer bupivacaine (PF), triamcinolone acetonide, sodium chloride flush, sodium chloride flush, lactated ringers, midazolam, and lactated ringers.   Meds ordered this encounter  Medications  . oxyCODONE (ROXICODONE) 5 MG immediate release tablet    Sig: Take 1 tablet (5 mg total) by mouth 2 (two) times daily.    Dispense:  45 tablet    Refill:  0    Do not fill until [id number]   Patient's Medications  New Prescriptions   No medications on file  Previous Medications   GABAPENTIN (NEURONTIN) 100 MG CAPSULE     Take 300 mg by mouth 3 (three) times daily.   IBUPROFEN (ADVIL,MOTRIN) 600 MG TABLET    Take 1 tablet (600 mg total) by mouth every 6 (six) hours as needed.   LEVETIRACETAM (KEPPRA) 500 MG TABLET    Take 1,500 mg by mouth 2 (two) times daily.    NORTRIPTYLINE (PAMELOR) 10 MG CAPSULE    Take 20 mg by mouth at bedtime.    TOPIRAMATE (TOPAMAX) 15 MG CAPSULE    Take 15 mg by mouth at bedtime.  Modified Medications   Modified Medication Previous Medication   OXYCODONE (ROXICODONE) 5 MG IMMEDIATE RELEASE TABLET oxyCODONE (ROXICODONE) 5 MG immediate release tablet      Take 1 tablet (5 mg total) by mouth 2 (two) times daily.    Take 1 tablet (5 mg total) by mouth 2 (two) times daily.  Discontinued Medications   No medications on file   ----------------------------------------------------------------------------------------------------------------------  Follow-up: Return in about 1 month (around 07/08/2017) for evaluation, procedure.    Molli Barrows, MD

## 2017-07-16 ENCOUNTER — Ambulatory Visit
Admission: RE | Admit: 2017-07-16 | Discharge: 2017-07-16 | Disposition: A | Discharge status: Home / Self Care | Payer: Medicare Other | Source: Ambulatory Visit | Attending: Anesthesiology | Admitting: Anesthesiology

## 2017-07-16 ENCOUNTER — Other Ambulatory Visit: Payer: Self-pay | Admitting: Anesthesiology

## 2017-07-16 ENCOUNTER — Ambulatory Visit (HOSPITAL_BASED_OUTPATIENT_CLINIC_OR_DEPARTMENT_OTHER): Payer: Medicare Other | Admitting: Anesthesiology

## 2017-07-16 ENCOUNTER — Encounter: Payer: Self-pay | Admitting: Anesthesiology

## 2017-07-16 VITALS — BP 121/75 | HR 87 | Temp 99.5°F | Resp 16 | Ht 65.0 in | Wt 178.0 lb

## 2017-07-16 DIAGNOSIS — M25512 Pain in left shoulder: Secondary | ICD-10-CM | POA: Insufficient documentation

## 2017-07-16 DIAGNOSIS — Q8500 Neurofibromatosis, unspecified: Secondary | ICD-10-CM | POA: Insufficient documentation

## 2017-07-16 DIAGNOSIS — R52 Pain, unspecified: Secondary | ICD-10-CM

## 2017-07-16 DIAGNOSIS — M160 Bilateral primary osteoarthritis of hip: Secondary | ICD-10-CM | POA: Insufficient documentation

## 2017-07-16 DIAGNOSIS — M170 Bilateral primary osteoarthritis of knee: Secondary | ICD-10-CM

## 2017-07-16 DIAGNOSIS — M5481 Occipital neuralgia: Secondary | ICD-10-CM

## 2017-07-16 DIAGNOSIS — G894 Chronic pain syndrome: Secondary | ICD-10-CM | POA: Diagnosis not present

## 2017-07-16 DIAGNOSIS — F1190 Opioid use, unspecified, uncomplicated: Secondary | ICD-10-CM

## 2017-07-16 DIAGNOSIS — Z79891 Long term (current) use of opiate analgesic: Secondary | ICD-10-CM | POA: Diagnosis not present

## 2017-07-16 DIAGNOSIS — R51 Headache: Secondary | ICD-10-CM | POA: Diagnosis not present

## 2017-07-16 MED ORDER — MIDAZOLAM HCL 5 MG/5ML IJ SOLN
INTRAMUSCULAR | Status: AC
  Filled 2017-07-16: qty 5

## 2017-07-16 MED ORDER — DEXAMETHASONE SODIUM PHOSPHATE 10 MG/ML IJ SOLN
10.0000 mg | Freq: Once | INTRAMUSCULAR | Status: AC
  Administered 2017-07-16: 10 mg

## 2017-07-16 MED ORDER — DEXAMETHASONE SODIUM PHOSPHATE 10 MG/ML IJ SOLN
INTRAMUSCULAR | Status: AC
  Filled 2017-07-16: qty 1

## 2017-07-16 MED ORDER — ROPIVACAINE HCL 2 MG/ML IJ SOLN
INTRAMUSCULAR | Status: AC
  Filled 2017-07-16: qty 10

## 2017-07-16 MED ORDER — OXYCODONE HCL 5 MG PO TABS: 5.0000 mg | ORAL_TABLET | Freq: Two times a day (BID) | ORAL | 0 refills | Status: DC

## 2017-07-16 MED ORDER — ROPIVACAINE HCL 2 MG/ML IJ SOLN
10.0000 mL | Freq: Once | INTRAMUSCULAR | Status: AC
  Administered 2017-07-16: 10 mL via EPIDURAL

## 2017-07-16 NOTE — Patient Instructions (Addendum)
Pain Management Discharge Instructions  General Discharge Instructions :  If you need to reach your doctor call: Monday-Friday 8:00 am - 4:00 pm at [phone] or toll free [phone].  After clinic hours [phone] to have operator reach doctor.  Bring all of your medication bottles to all your appointments in the pain clinic.  To cancel or reschedule your appointment with Pain Management please remember to call 24 hours in advance to avoid a fee.  Refer to the educational materials which you have been given on: General Risks, I had my Procedure. Discharge Instructions, Post Sedation.  Post Procedure Instructions:  The drugs you were given will stay in your system until tomorrow, so for the next 24 hours you should not drive, make any legal decisions or drink any alcoholic beverages.  You may eat anything you prefer, but it is better to start with liquids then soups and crackers, and gradually work up to solid foods.  Please notify your doctor immediately if you have any unusual bleeding, trouble breathing or pain that is not related to your normal pain.  Depending on the type of procedure that was done, some parts of your body may feel week and/or numb.  This usually clears up by tonight or the next day.  Walk with the use of an assistive device or accompanied by an adult for the 24 hours.  You may use ice on the affected area for the first 24 hours.  Put ice in a Ziploc bag and cover with a towel and place against area 15 minutes on 15 minutes off.  You may switch to heat after 24 hours.GENERAL RISKS AND COMPLICATIONS  What are the risk, side effects and possible complications? Generally speaking, most procedures are safe.  However, with any procedure there are risks, side effects, and the possibility of complications.  The risks and complications are dependent upon the sites that are lesioned, or the type of nerve block to be performed.  The closer the procedure is to the spine,  the more serious the risks are.  Great care is taken when placing the radio frequency needles, block needles or lesioning probes, but sometimes complications can occur. 1. Infection: Any time there is an injection through the skin, there is a risk of infection.  This is why sterile conditions are used for these blocks.  There are four possible types of infection. 1. Localized skin infection. 2. Central Nervous System Infection-This can be in the form of Meningitis, which can be deadly. 3. Epidural Infections-This can be in the form of an epidural abscess, which can cause pressure inside of the spine, causing compression of the spinal cord with subsequent paralysis. This would require an emergency surgery to decompress, and there are no guarantees that the patient would recover from the paralysis. 4. Discitis-This is an infection of the intervertebral discs.  It occurs in about 1% of discography procedures.  It is difficult to treat and it may lead to surgery.        2. Pain: the needles have to go through skin and soft tissues, will cause soreness.       3. Damage to internal structures:  The nerves to be lesioned may be near blood vessels or    other nerves which can be potentially damaged.       4. Bleeding: Bleeding is more common if the patient is taking blood thinners such as  aspirin, Coumadin, Ticiid, Plavix, etc., or if he/she have some genetic predisposition  such as   hemophilia. Bleeding into the spinal canal can cause compression of the spinal  cord with subsequent paralysis.  This would require an emergency surgery to  decompress and there are no guarantees that the patient would recover from the  paralysis.       5. Pneumothorax:  Puncturing of a lung is a possibility, every time a needle is introduced in  the area of the chest or upper back.  Pneumothorax refers to free air around the  collapsed lung(s), inside of the thoracic cavity (chest cavity).  Another two possible  complications  related to a similar event would include: Hemothorax and Chylothorax.   These are variations of the Pneumothorax, where instead of air around the collapsed  lung(s), you may have blood or chyle, respectively.       6. Spinal headaches: They may occur with any procedures in the area of the spine.       7. Persistent CSF (Cerebro-Spinal Fluid) leakage: This is a rare problem, but may occur  with prolonged intrathecal or epidural catheters either due to the formation of a fistulous  track or a dural tear.       8. Nerve damage: By working so close to the spinal cord, there is always a possibility of  nerve damage, which could be as serious as a permanent spinal cord injury with  paralysis.       9. Death:  Although rare, severe deadly allergic reactions known as "Anaphylactic  reaction" can occur to any of the medications used.      10. Worsening of the symptoms:  We can always make thing worse.  What are the chances of something like this happening? Chances of any of this occuring are extremely low.  By statistics, you have more of a chance of getting killed in a motor vehicle accident: while driving to the hospital than any of the above occurring .  Nevertheless, you should be aware that they are possibilities.  In general, it is similar to taking a shower.  Everybody knows that you can slip, hit your head and get killed.  Does that mean that you should not shower again?  Nevertheless always keep in mind that statistics do not mean anything if you happen to be on the wrong side of them.  Even if a procedure has a 1 (one) in a 1,000,000 (million) chance of going wrong, it you happen to be that one..Also, keep in mind that by statistics, you have more of a chance of having something go wrong when taking medications.  Who should not have this procedure? If you are on a blood thinning medication (e.g. Coumadin, Plavix, see list of "Blood Thinners"), or if you have an active infection going on, you should not  have the procedure.  If you are taking any blood thinners, please inform your physician.  How should I prepare for this procedure?  Do not eat or drink anything at least six hours prior to the procedure.  Bring a driver with you .  It cannot be a taxi.  Come accompanied by an adult that can drive you back, and that is strong enough to help you if your legs get weak or numb from the local anesthetic.  Take all of your medicines the morning of the procedure with just enough water to swallow them.  If you have diabetes, make sure that you are scheduled to have your procedure done first thing in the morning, whenever possible.  If you have diabetes,   take only half of your insulin dose and notify our nurse that you have done so as soon as you arrive at the clinic.  If you are diabetic, but only take blood sugar pills (oral hypoglycemic), then do not take them on the morning of your procedure.  You may take them after you have had the procedure.  Do not take aspirin or any aspirin-containing medications, at least eleven (11) days prior to the procedure.  They may prolong bleeding.  Wear loose fitting clothing that may be easy to take off and that you would not mind if it got stained with Betadine or blood.  Do not wear any jewelry or perfume  Remove any nail coloring.  It will interfere with some of our monitoring equipment.  NOTE: Remember that this is not meant to be interpreted as a complete list of all possible complications.  Unforeseen problems may occur.  BLOOD THINNERS The following drugs contain aspirin or other products, which can cause increased bleeding during surgery and should not be taken for 2 weeks prior to and 1 week after surgery.  If you should need take something for relief of minor pain, you may take acetaminophen which is found in Tylenol,m Datril, Anacin-3 and Panadol. It is not blood thinner. The products listed below are.  Do not take any of the products listed below  in addition to any listed on your instruction sheet.  A.P.C or A.P.C with Codeine Codeine Phosphate Capsules #3 Ibuprofen Ridaura  ABC compound Congesprin Imuran rimadil  Advil Cope Indocin Robaxisal  Alka-Seltzer Effervescent Pain Reliever and Antacid Coricidin or Coricidin-D  Indomethacin Rufen  Alka-Seltzer plus Cold Medicine Cosprin Ketoprofen S-A-C Tablets  Anacin Analgesic Tablets or Capsules Coumadin Korlgesic Salflex  Anacin Extra Strength Analgesic tablets or capsules CP-2 Tablets Lanoril Salicylate  Anaprox Cuprimine Capsules Levenox Salocol  Anexsia-D Dalteparin Magan Salsalate  Anodynos Darvon compound Magnesium Salicylate Sine-off  Ansaid Dasin Capsules Magsal Sodium Salicylate  Anturane Depen Capsules Marnal Soma  APF Arthritis pain formula Dewitt's Pills Measurin Stanback  Argesic Dia-Gesic Meclofenamic Sulfinpyrazone  Arthritis Bayer Timed Release Aspirin Diclofenac Meclomen Sulindac  Arthritis pain formula Anacin Dicumarol Medipren Supac  Analgesic (Safety coated) Arthralgen Diffunasal Mefanamic Suprofen  Arthritis Strength Bufferin Dihydrocodeine Mepro Compound Suprol  Arthropan liquid Dopirydamole Methcarbomol with Aspirin Synalgos  ASA tablets/Enseals Disalcid Micrainin Tagament  Ascriptin Doan's Midol Talwin  Ascriptin A/D Dolene Mobidin Tanderil  Ascriptin Extra Strength Dolobid Moblgesic Ticlid  Ascriptin with Codeine Doloprin or Doloprin with Codeine Momentum Tolectin  Asperbuf Duoprin Mono-gesic Trendar  Aspergum Duradyne Motrin or Motrin IB Triminicin  Aspirin plain, buffered or enteric coated Durasal Myochrisine Trigesic  Aspirin Suppositories Easprin Nalfon Trillsate  Aspirin with Codeine Ecotrin Regular or Extra Strength Naprosyn Uracel  Atromid-S Efficin Naproxen Ursinus  Auranofin Capsules Elmiron Neocylate Vanquish  Axotal Emagrin Norgesic Verin  Azathioprine Empirin or Empirin with Codeine Normiflo Vitamin E  Azolid Emprazil Nuprin Voltaren  Bayer  Aspirin plain, buffered or children's or timed BC Tablets or powders Encaprin Orgaran Warfarin Sodium  Buff-a-Comp Enoxaparin Orudis Zorpin  Buff-a-Comp with Codeine Equegesic Os-Cal-Gesic   Buffaprin Excedrin plain, buffered or Extra Strength Oxalid   Bufferin Arthritis Strength Feldene Oxphenbutazone   Bufferin plain or Extra Strength Feldene Capsules Oxycodone with Aspirin   Bufferin with Codeine Fenoprofen Fenoprofen Pabalate or Pabalate-SF   Buffets II Flogesic Panagesic   Buffinol plain or Extra Strength Florinal or Florinal with Codeine Panwarfarin   Buf-Tabs Flurbiprofen Penicillamine   Butalbital Compound Four-way cold tablets   Penicillin   Butazolidin Fragmin Pepto-Bismol   Carbenicillin Geminisyn Percodan   Carna Arthritis Reliever Geopen Persantine   Carprofen Gold's salt Persistin   Chloramphenicol Goody's Phenylbutazone   Chloromycetin Haltrain Piroxlcam   Clmetidine heparin Plaquenil   Cllnoril Hyco-pap Ponstel   Clofibrate Hydroxy chloroquine Propoxyphen         Before stopping any of these medications, be sure to consult the physician who ordered them.  Some, such as Coumadin (Warfarin) are ordered to prevent or treat serious conditions such as "deep thrombosis", "pumonary embolisms", and other heart problems.  The amount of time that you may need off of the medication may also vary with the medication and the reason for which you were taking it.  If you are taking any of these medications, please make sure you notify your pain physician before you undergo any procedures.         Epidural Steroid Injection Patient Information  Description: The epidural space surrounds the nerves as they exit the spinal cord.  In some patients, the nerves can be compressed and inflamed by a bulging disc or a tight spinal canal (spinal stenosis).  By injecting steroids into the epidural space, we can bring irritated nerves into direct contact with a potentially helpful medication.   These steroids act directly on the irritated nerves and can reduce swelling and inflammation which often leads to decreased pain.  Epidural steroids may be injected anywhere along the spine and from the neck to the low back depending upon the location of your pain.   After numbing the skin with local anesthetic (like Novocaine), a small needle is passed into the epidural space slowly.  You may experience a sensation of pressure while this is being done.  The entire block usually last less than 10 minutes.  Conditions which may be treated by epidural steroids:   Low back and leg pain  Neck and arm pain  Spinal stenosis  Post-laminectomy syndrome  Herpes zoster (shingles) pain  Pain from compression fractures  Preparation for the injection:  1. Do not eat any solid food or dairy products within 8 hours of your appointment.  2. You may drink clear liquids up to 3 hours before appointment.  Clear liquids include water, black coffee, juice or soda.  No milk or cream please. 3. You may take your regular medication, including pain medications, with a sip of water before your appointment  Diabetics should hold regular insulin (if taken separately) and take 1/2 normal NPH dos the morning of the procedure.  Carry some sugar containing items with you to your appointment. 4. A driver must accompany you and be prepared to drive you home after your procedure.  5. Bring all your current medications with your. 6. An IV may be inserted and sedation may be given at the discretion of the physician.   7. A blood pressure cuff, EKG and other monitors will often be applied during the procedure.  Some patients may need to have extra oxygen administered for a short period. 8. You will be asked to provide medical information, including your allergies, prior to the procedure.  We must know immediately if you are taking blood thinners (like Coumadin/Warfarin)  Or if you are allergic to IV iodine contrast (dye). We  must know if you could possible be pregnant.  Possible side-effects:  Bleeding from needle site  Infection (rare, may require surgery)  Nerve injury (rare)  Numbness & tingling (temporary)  Difficulty urinating (rare, temporary)  Spinal headache (   a headache worse with upright posture)  Light -headedness (temporary)  Pain at injection site (several days)  Decreased blood pressure (temporary)  Weakness in arm/leg (temporary)  Pressure sensation in back/neck (temporary)  Call if you experience:  Fever/chills associated with headache or increased back/neck pain.  Headache worsened by an upright position.  New onset weakness or numbness of an extremity below the injection site  Hives or difficulty breathing (go to the emergency room)  Inflammation or drainage at the infection site  Severe back/neck pain  Any new symptoms which are concerning to you  Please note:  Although the local anesthetic injected can often make your back or neck feel good for several hours after the injection, the pain will likely return.  It takes 3-7 days for steroids to work in the epidural space.  You may not notice any pain relief for at least that one week.  If effective, we will often do a series of three injections spaced 3-6 weeks apart to maximally decrease your pain.  After the initial series, we generally will wait several months before considering a repeat injection of the same type.  If you have any questions, please call [phone] [facility]  General Discharge Instructions :  If you need to reach your doctor call: Monday-Friday 8:00 am - 4:00 pm at [phone] or toll free [phone].  After clinic hours [phone] to have operator reach doctor.  Bring all of your medication bottles to all your appointments in the pain clinic.  To cancel or reschedule your appointment with Pain Management please  remember to call 24 hours in advance to avoid a fee.  Refer to the educational materials which you have been given on: General Risks, I had my Procedure. Discharge Instructions, Post Sedation.  Post Procedure Instructions:  The drugs you were given will stay in your system until tomorrow, so for the next 24 hours you should not drive, make any legal decisions or drink any alcoholic beverages.  You may eat anything you prefer, but it is better to start with liquids then soups and crackers, and gradually work up to solid foods.  Please notify your doctor immediately if you have any unusual bleeding, trouble breathing or pain that is not related to your normal pain.  Depending on the type of procedure that was done, some parts of your body may feel week and/or numb.  This usually clears up by tonight or the next day.  Walk with the use of an assistive device or accompanied by an adult for the 24 hours.  You may use ice on the affected area for the first 24 hours.  Put ice in a Ziploc bag and cover with a towel and place against area 15 minutes on 15 minutes off.  You may switch to heat after 24 hours.Epidural Steroid Injection Patient Information  Description: The epidural space surrounds the nerves as they exit the spinal cord.  In some patients, the nerves can be compressed and inflamed by a bulging disc or a tight spinal canal (spinal stenosis).  By injecting steroids into the epidural space, we can bring irritated nerves into direct contact with a potentially helpful medication.  These steroids act directly on the irritated nerves and can reduce swelling and inflammation which often leads to decreased pain.  Epidural steroids may be injected anywhere along the spine and from the neck to the low back depending upon the location of your pain.   After  numbing the skin with local anesthetic (like Novocaine), a small needle is passed into the epidural space slowly.  You may experience a sensation of  pressure while this is being done.  The entire block usually last less than 10 minutes.  Conditions which may be treated by epidural steroids:   Low back and leg pain  Neck and arm pain  Spinal stenosis  Post-laminectomy syndrome  Herpes zoster (shingles) pain  Pain from compression fractures  Preparation for the injection:  9. Do not eat any solid food or dairy products within 8 hours of your appointment.  10. You may drink clear liquids up to 3 hours before appointment.  Clear liquids include water, black coffee, juice or soda.  No milk or cream please. 11. You may take your regular medication, including pain medications, with a sip of water before your appointment  Diabetics should hold regular insulin (if taken separately) and take 1/2 normal NPH dos the morning of the procedure.  Carry some sugar containing items with you to your appointment. 12. A driver must accompany you and be prepared to drive you home after your procedure.  13. Bring all your current medications with your. 14. An IV may be inserted and sedation may be given at the discretion of the physician.   15. A blood pressure cuff, EKG and other monitors will often be applied during the procedure.  Some patients may need to have extra oxygen administered for a short period. 61. You will be asked to provide medical information, including your allergies, prior to the procedure.  We must know immediately if you are taking blood thinners (like Coumadin/Warfarin)  Or if you are allergic to IV iodine contrast (dye). We must know if you could possible be pregnant.  Possible side-effects:  Bleeding from needle site  Infection (rare, may require surgery)  Nerve injury (rare)  Numbness & tingling (temporary)  Difficulty urinating (rare, temporary)  Spinal headache ( a headache worse with upright posture)  Light -headedness (temporary)  Pain at injection site (several days)  Decreased blood pressure  (temporary)  Weakness in arm/leg (temporary)  Pressure sensation in back/neck (temporary)  Call if you experience:  Fever/chills associated with headache or increased back/neck pain.  Headache worsened by an upright position.  New onset weakness or numbness of an extremity below the injection site  Hives or difficulty breathing (go to the emergency room)  Inflammation or drainage at the infection site  Severe back/neck pain  Any new symptoms which are concerning to you  Please note:  Although the local anesthetic injected can often make your back or neck feel good for several hours after the injection, the pain will likely return.  It takes 3-7 days for steroids to work in the epidural space.  You may not notice any pain relief for at least that one week.  If effective, we will often do a series of three injections spaced 3-6 weeks apart to maximally decrease your pain.  After the initial series, we generally will wait several months before considering a repeat injection of the same type.  If you have any questions, please call [phone] [facility]

## 2017-07-16 NOTE — Progress Notes (Signed)
1618 Versed 2 mg given IV

## 2017-07-16 NOTE — Progress Notes (Signed)
Nursing Pain Medication Assessment:  Safety precautions to be maintained throughout the outpatient stay will include: orient to surroundings, keep bed in low position, maintain call bell within reach at all times, provide assistance with transfer out of bed and ambulation.  Medication Inspection Compliance: Ms. Mehra did not comply with our request to bring her pills to be counted. She was reminded that bringing the medication bottles, even when empty, is a requirement.  Medication: None brought in. Pill/Patch Count: None available to be counted. Bottle Appearance: No container available. Did not bring bottle(s) to appointment. Filled Date: N/A Last Medication intake:  Yesterday

## 2017-07-17 ENCOUNTER — Telehealth: Payer: Self-pay

## 2017-07-17 NOTE — Progress Notes (Signed)
Subjective:  Patient ID: Valerie Mckinney, female    DOB: 04/04/1981  Age: 36 y.o. MRN: [id number]  CC: Headache   Procedure: Bilateral greater occipital nerve block under moderate sedation   HPI Valerie Mckinney presents for reevaluation. She continues to have tension headaches of the same quality characteristic condition region as before. These primarily start in the lower neck region with a tension like viselike radiation up over the posterior portion of her scalp. He is occasionally precipitate tension headaches. In the past she has had greater occipital nerve blocks and these have helped to reduce the severity of her pain and the frequency of these headaches. She has been taking her medications as prescribed. She has been on oxycodone chronically generally taking 1 and occasionally 2 tablets per day averaging 45 tablets per month. She denies any diverting or illicit use with medications and based on her narcotic assessment sheet she continues to derive good functional lifestyle improvement with the medications. Unfortunately she has failed conservative therapy and required these chronically. She also has neurofibromatosis with chronic diffuse body pain that she describes. This also is improved with her medication management.  Outpatient Medications Prior to Visit  Medication Sig Dispense Refill  . gabapentin (NEURONTIN) 100 MG capsule Take 300 mg by mouth 3 (three) times daily.    Marland Kitchen ibuprofen (ADVIL,MOTRIN) 600 MG tablet Take 1 tablet (600 mg total) by mouth every 6 (six) hours as needed. 30 tablet 0  . levETIRAcetam (KEPPRA) 500 MG tablet Take 1,500 mg by mouth 2 (two) times daily.     . nortriptyline (PAMELOR) 10 MG capsule Take 20 mg by mouth at bedtime.     . topiramate (TOPAMAX) 15 MG capsule Take 15 mg by mouth at bedtime.    Marland Kitchen oxyCODONE (ROXICODONE) 5 MG immediate release tablet Take 1 tablet (5 mg total) by mouth 2 (two) times daily. 45 tablet 0   Facility-Administered  Medications Prior to Visit  Medication Dose Route Frequency Provider Last Rate Last Dose  . bupivacaine (PF) (MARCAINE) 0.25 % injection 30 mL  30 mL Other Once Mohammed Kindle, MD      . lactated ringers infusion 1,000 mL  1,000 mL Intravenous Continuous Mohammed Kindle, MD      . lactated ringers infusion 1,000 mL  1,000 mL Intravenous Continuous Mohammed Kindle, MD 125 mL/hr at 05/01/16 1037 1,000 mL at 05/01/16 1037  . midazolam (VERSED) 5 MG/5ML injection 5 mg  5 mg Intravenous Once Mohammed Kindle, MD      . sodium chloride flush (NS) 0.9 % injection 20 mL  20 mL Other Once Mohammed Kindle, MD      . sodium chloride flush (NS) 0.9 % injection 20 mL  20 mL Other Once Mohammed Kindle, MD      . triamcinolone acetonide (KENALOG-40) injection 40 mg  40 mg Other Once Mohammed Kindle, MD        Review of Systems CNS: No confusion or sedation Cardiac: No angina or palpitations GI: No constipation or abdominal pain  Objective:  BP 121/75   Pulse 87   Temp 99.5 F (37.5 C) (Oral)   Resp 16   Ht 5\' 5"  (1.651 m)   Wt 178 lb (80.7 kg)   SpO2 100%   BMI 29.62 kg/m    BP Readings from Last 3 Encounters:  07/16/17 121/75  06/07/17 (!) 117/92  05/08/17 121/84     Wt Readings from Last 3 Encounters:  07/16/17 178 lb (80.7 kg)  05/08/17 195 lb (88.5 kg)  04/20/17 198 lb (89.8 kg)     Physical Exam Pt is alert and oriented PERRL EOMI HEART IS RRR no murmur or rub LCTA no wheezing or rhales MUSCULOSKELETALReveals some persistent tenderness over the greater occipital notch in the posterior scalp. Palpation of this area does precipitate her primary pain complaint.  Labs  No results found for: HGBA1C Lab Results  Component Value Date   CREATININE 0.66 11/02/2015    -------------------------------------------------------------------------------------------------------------------- Lab Results  Component Value Date   WBC 6.0 11/02/2015   HGB 11.5 (L) 11/02/2015   HCT 36.7  11/02/2015   PLT 214 11/02/2015   GLUCOSE 106 (H) 11/02/2015   ALT 13 (L) 11/02/2015   AST 14 (L) 11/02/2015   NA 136 11/02/2015   K 3.9 11/02/2015   CL 105 11/02/2015   CREATININE 0.66 11/02/2015   BUN 7 11/02/2015   CO2 25 11/02/2015    --------------------------------------------------------------------------------------------------------------------- Dg C-arm 1-60 Min-no Report  Result Date: 07/16/2017 Fluoroscopy was utilized by the requesting physician.  No radiographic interpretation.     Assessment & Plan:   [name] was seen today for headache.  Diagnoses and all orders for this visit:  Bilateral occipital neuralgia -     dexamethasone (DECADRON) injection 10 mg; 1 mL (10 mg total) by Other route once. -     ropivacaine (PF) 2 mg/mL (0.2%) (NAROPIN) injection 10 mL; 10 mLs by Epidural route once. -     GREATER OCCIPITAL NERVE BLOCK; Future  Primary osteoarthritis of both knees  Neurofibromatosis (HCC)  Chronic pain syndrome  Chronic, continuous use of opioids  Primary osteoarthritis of both hips  Pain in joint of left shoulder  Other orders -     oxyCODONE (ROXICODONE) 5 MG immediate release tablet; Take 1 tablet (5 mg total) by mouth 2 (two) times daily.        ----------------------------------------------------------------------------------------------------------------------  Problem List Items Addressed This Visit      Nervous and Auditory   Neurofibromatosis (HCC)     Musculoskeletal and Integument   DJD (degenerative joint disease) of knee   Relevant Medications   oxyCODONE (ROXICODONE) 5 MG immediate release tablet   dexamethasone (DECADRON) injection 10 mg (Completed)     Other   Bilateral occipital neuralgia - Primary   Relevant Medications   oxyCODONE (ROXICODONE) 5 MG immediate release tablet   dexamethasone (DECADRON) injection 10 mg (Completed)   ropivacaine (PF) 2 mg/mL (0.2%) (NAROPIN) injection 10 mL (Completed)   Other  Relevant Orders   GREATER OCCIPITAL NERVE BLOCK    Other Visit Diagnoses    Chronic pain syndrome       Chronic, continuous use of opioids       Primary osteoarthritis of both hips       Pain in joint of left shoulder            ----------------------------------------------------------------------------------------------------------------------  1. Bilateral occipital neuralgia She desires to proceed with greater occipital nerve block today. We will gone over the risks and benefits of the procedure in full detail. We'll plan on doing this with light sedation. She states that she is very anxious about injection therapy but has tolerated this in the past with sedation. She is scheduled to return to clinic in 1 month for reevaluation possible repeat injection at that time. All her questions about answered. I believe she understands the nature the procedure. - dexamethasone (DECADRON) injection 10 mg; 1 mL (10 mg total) by Other route once. - ropivacaine (PF)  2 mg/mL (0.2%) (NAROPIN) injection 10 mL; 10 mLs by Epidural route once. - GREATER OCCIPITAL NERVE BLOCK; Future  2. Primary osteoarthritis of both knees Continue with her current baseline medication management. We have reviewed the [facility] practitioner database information. It is appropriate and we will have her refill medications for the next month.  3. Neurofibromatosis ([place]) As above  4. Chronic pain syndrome As above as above  5. Chronic, continuous use of opioids   6. Primary osteoarthritis of both hips   7. Pain in joint of left shoulder     ----------------------------------------------------------------------------------------------------------------------  I am having Valerie Mckinney maintain her levETIRAcetam, nortriptyline, gabapentin, topiramate, ibuprofen, and oxyCODONE. We administered dexamethasone and ropivacaine (PF) 2 mg/mL (0.2%). We will continue to administer bupivacaine (PF), triamcinolone  acetonide, sodium chloride flush, sodium chloride flush, lactated ringers, midazolam, and lactated ringers.   Meds ordered this encounter  Medications  . oxyCODONE (ROXICODONE) 5 MG immediate release tablet    Sig: Take 1 tablet (5 mg total) by mouth 2 (two) times daily.    Dispense:  45 tablet    Refill:  0    Do not fill until [id number]  . dexamethasone (DECADRON) injection 10 mg  . ropivacaine (PF) 2 mg/mL (0.2%) (NAROPIN) injection 10 mL   Patient's Medications  New Prescriptions   No medications on file  Previous Medications   GABAPENTIN (NEURONTIN) 100 MG CAPSULE    Take 300 mg by mouth 3 (three) times daily.   IBUPROFEN (ADVIL,MOTRIN) 600 MG TABLET    Take 1 tablet (600 mg total) by mouth every 6 (six) hours as needed.   LEVETIRACETAM (KEPPRA) 500 MG TABLET    Take 1,500 mg by mouth 2 (two) times daily.    NORTRIPTYLINE (PAMELOR) 10 MG CAPSULE    Take 20 mg by mouth at bedtime.    TOPIRAMATE (TOPAMAX) 15 MG CAPSULE    Take 15 mg by mouth at bedtime.  Modified Medications   Modified Medication Previous Medication   OXYCODONE (ROXICODONE) 5 MG IMMEDIATE RELEASE TABLET oxyCODONE (ROXICODONE) 5 MG immediate release tablet      Take 1 tablet (5 mg total) by mouth 2 (two) times daily.    Take 1 tablet (5 mg total) by mouth 2 (two) times daily.  Discontinued Medications   No medications on file   ---------------------------------------------------------------------------------------------------------------------- Procedure: Greater occipital nerve block #1 bilaterally under sedation.  PROCEDURE:  Greater occipital nerve block bilaterally , EKG, blood pressure, pulse, pulse oximetry monitoring.  the patient received 2 mg of IV Versed for mild sedation. Following identification of the nuchal ridge, a 22-gauge needle was introduced subcutaneous and 1% lidocaine was injected to the subcutaneous and fascia at the level of the nuchal ridge. Following this a 22-gauge quickie needle was used  to infiltrate approximately 3 cc of 0.2% ropivacaine mixed with 4 mg of Decadron in a fanlike distribution medial to the occipital artery.  there was negative negative.  Needle was removed.  the patient tolerated the procedure without difficulty. Her in the procedure she did have considerable and apparent discomfort. She was repeatedly asked if she desired to abort the procedure and she desired to proceed. She complained about the level of sedation and desired to be fully asleep however I thought it was unsafe to administer heavy sedation for this type of procedure. I have counseled her regarding a repeat procedure and the nature of sedation for such a repeat nerve block. In recovery she was tearful but understanding. She has been  instructed to contact the pain control Center she has any problems along the injection She is due for return in one month.   Follow-up: Return for evaluation, procedure.    Molli Barrows, MD

## 2017-07-17 NOTE — Telephone Encounter (Signed)
Post procedure phone call.  Patient states she is doing OK 

## 2017-08-21 ENCOUNTER — Other Ambulatory Visit: Payer: Self-pay | Admitting: Anesthesiology

## 2017-08-21 ENCOUNTER — Ambulatory Visit
Admission: RE | Admit: 2017-08-21 | Discharge: 2017-08-21 | Disposition: A | Discharge status: Home / Self Care | Payer: Medicare Other | Source: Ambulatory Visit | Attending: Anesthesiology | Admitting: Anesthesiology

## 2017-08-21 ENCOUNTER — Ambulatory Visit: Payer: Medicare Other | Admitting: Anesthesiology

## 2017-08-21 DIAGNOSIS — R52 Pain, unspecified: Secondary | ICD-10-CM

## 2017-11-09 IMAGING — MR MR KNEE*R* W/O CM
5 series · 40 of 40 positions shown · non-contrast
Comparison: Lower leg radiographs dated 07/28/2005

CLINICAL DATA: Chronic progressive diffuse right knee pain but
primarily anterior. Painful range of motion. Popping.

EXAM:
MRI OF THE RIGHT KNEE WITHOUT CONTRAST
TECHNIQUE: Multiplanar, multisequence MR imaging of the knee was performed. No
intravenous contrast was administered.

[Series 3: PD fat-sat · axial · 3.0mm · 0.29mm/px · z∈[-67,+55]mm · 8 of 38 slices shown (1 of 3)]
[im 1/38]
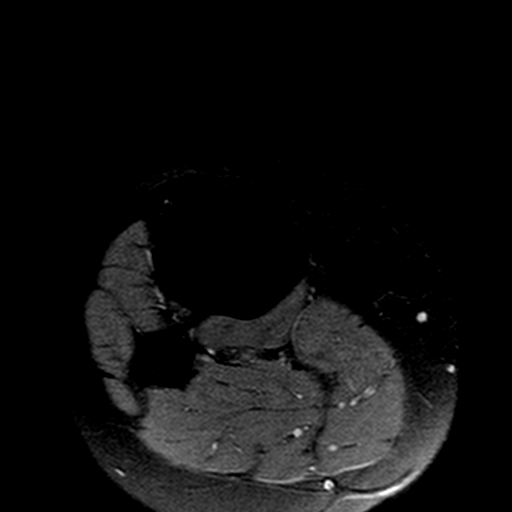
[im 6/38]
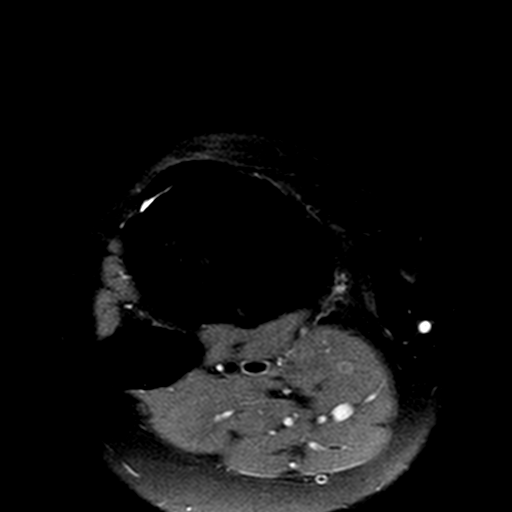
[im 11/38]
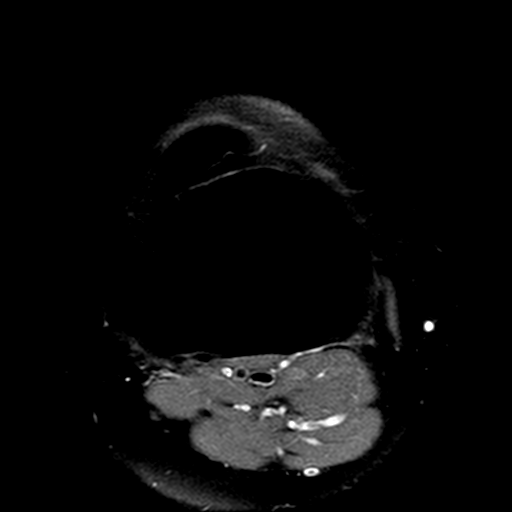
[im 16/38]
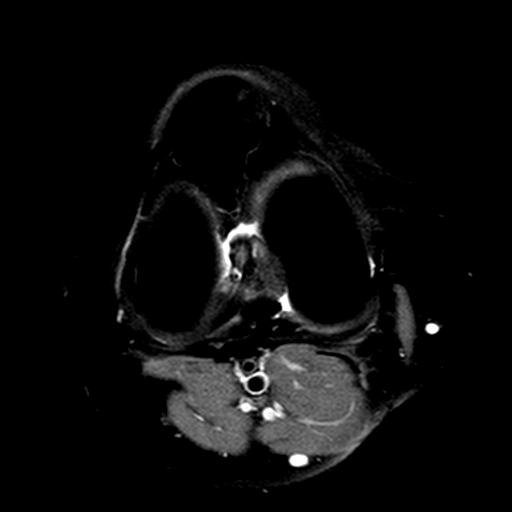
[im 22/38]
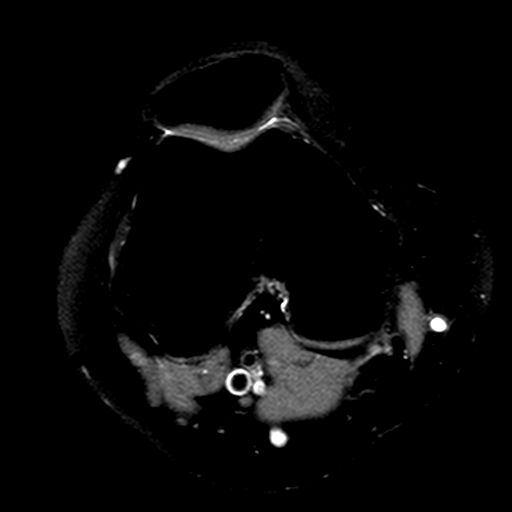
[im 27/38]
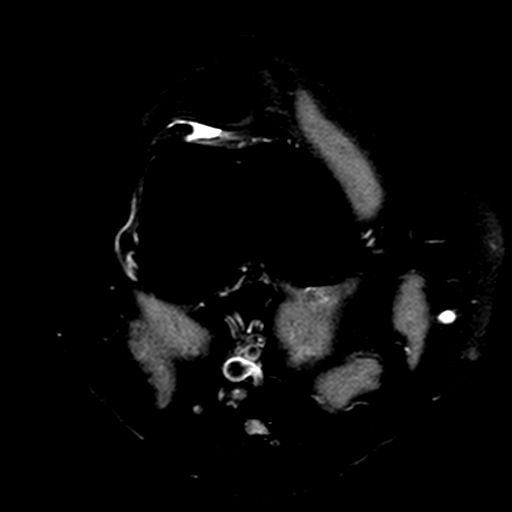
[im 32/38]
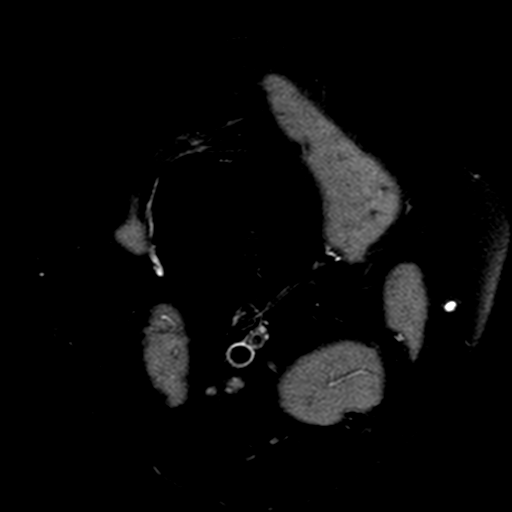
[im 38/38]
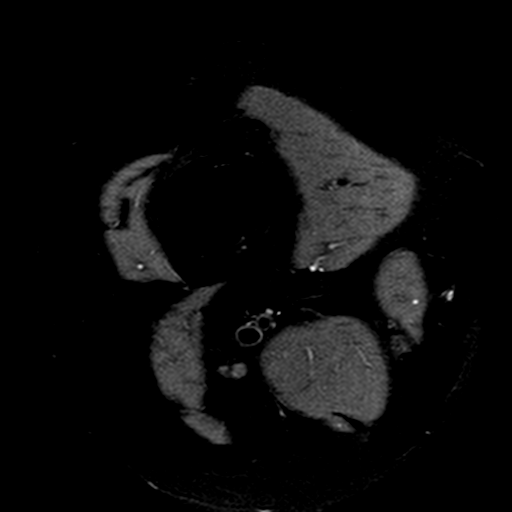

[Series 4: T2 fat-sat · coronal · 3.0mm · 0.62mm/px · 8 of 34 slices shown]
[im 1/34]
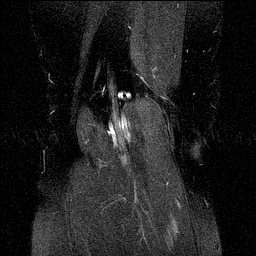
[im 5/34]
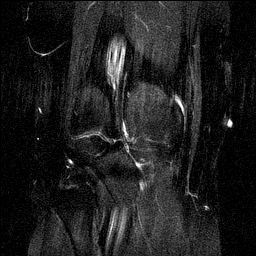
[im 10/34]
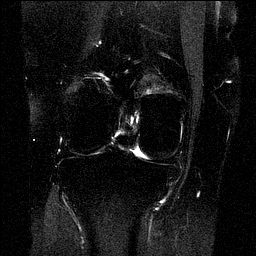
[im 15/34]
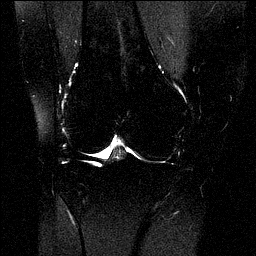
[im 19/34]
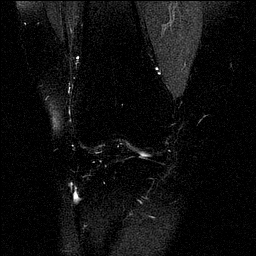
[im 24/34]
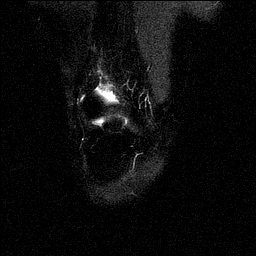
[im 29/34]
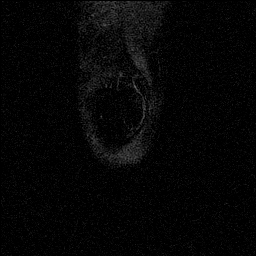
[im 34/34]
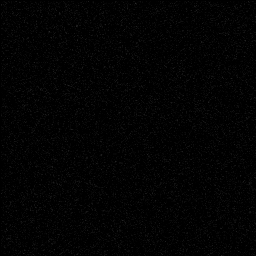

[Series 5: PD fat-sat · coronal · 3.0mm · 0.62mm/px · 8 of 34 slices shown (2 of 3)]
[im 1/34]
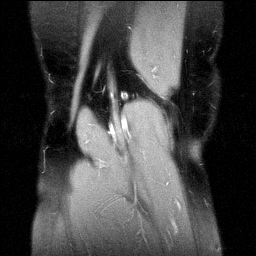
[im 5/34]
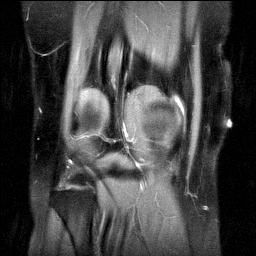
[im 10/34]
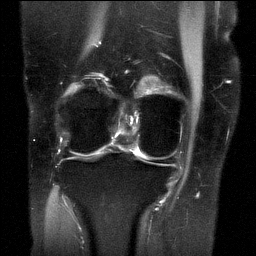
[im 15/34]
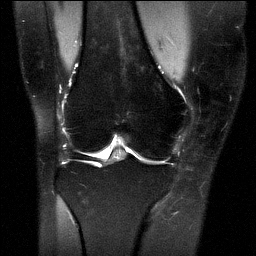
[im 19/34]
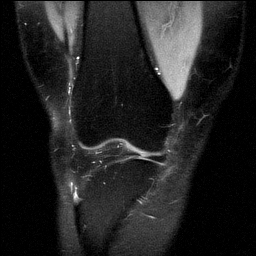
[im 24/34]
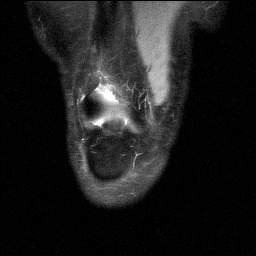
[im 29/34]
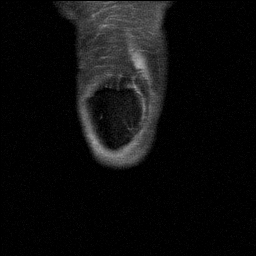
[im 34/34]
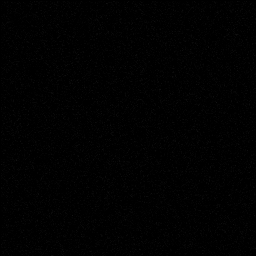

[Series 6: PD fat-sat · sagittal · 3.0mm · 0.62mm/px · 8 of 35 slices shown (3 of 3)]
[im 1/35]
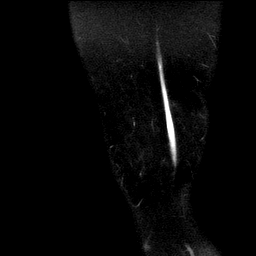
[im 5/35]
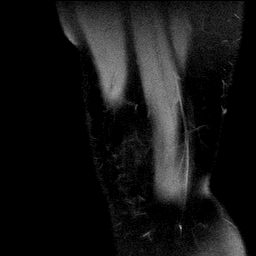
[im 10/35]
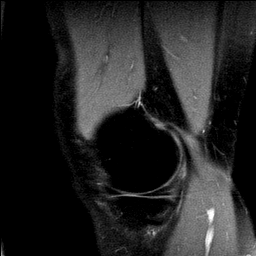
[im 15/35]
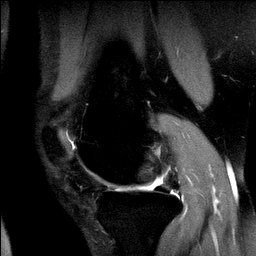
[im 20/35]
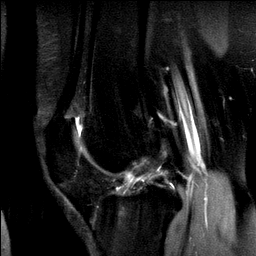
[im 25/35]
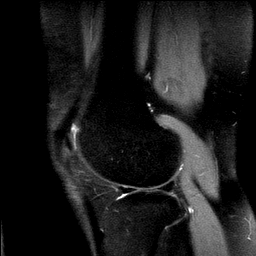
[im 30/35]
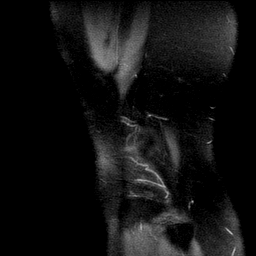
[im 35/35]
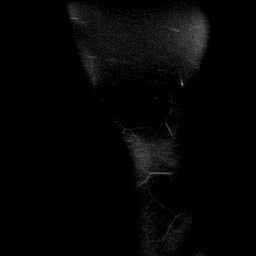

[Series 7: T1 · coronal · 3.0mm · 0.62mm/px · 8 of 34 slices shown]
[im 1/34]
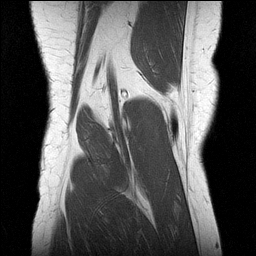
[im 5/34]
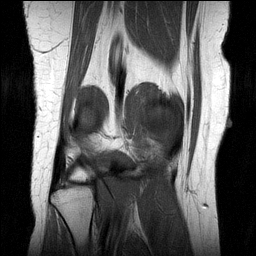
[im 10/34]
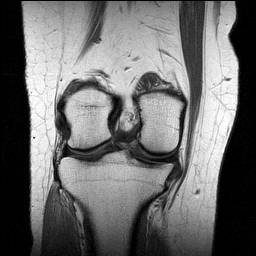
[im 15/34]
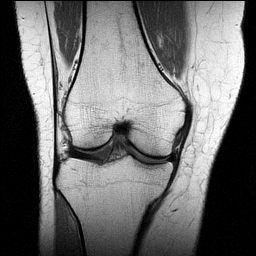
[im 19/34]
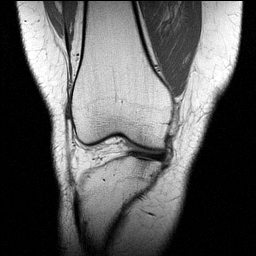
[im 24/34]
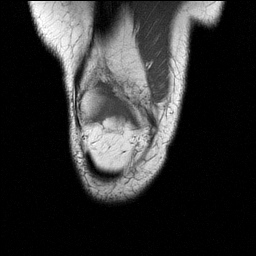
[im 29/34]
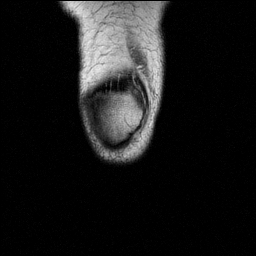
[im 34/34]
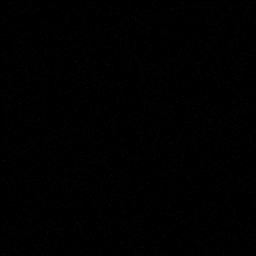

[40 of 40 positions shown; findings below may reference images not displayed]

FINDINGS: MENISCI

Medial meniscus:  Normal.

Lateral meniscus:  Normal.

LIGAMENTS

Cruciates:  Normal.

Collaterals:  Normal.

CARTILAGE

Patellofemoral:  Normal.

Medial:  Normal.

Lateral:  Normal.

Joint:  Normal.

Popliteal Fossa:  Normal.

Extensor Mechanism:  Normal.

Bones:  Normal.
IMPRESSION: Normal MRI of the right knee.

## 2017-11-10 ENCOUNTER — Emergency Department: Payer: Medicare Other

## 2017-11-10 ENCOUNTER — Other Ambulatory Visit: Payer: Self-pay

## 2017-11-10 ENCOUNTER — Emergency Department
Admission: EM | Admit: 2017-11-10 | Discharge: 2017-11-10 | Disposition: A | Discharge status: Home / Self Care | Payer: Medicare Other | Attending: Emergency Medicine | Admitting: Emergency Medicine

## 2017-11-10 DIAGNOSIS — F17210 Nicotine dependence, cigarettes, uncomplicated: Secondary | ICD-10-CM | POA: Insufficient documentation

## 2017-11-10 DIAGNOSIS — W19XXXA Unspecified fall, initial encounter: Secondary | ICD-10-CM | POA: Diagnosis not present

## 2017-11-10 DIAGNOSIS — Y999 Unspecified external cause status: Secondary | ICD-10-CM | POA: Insufficient documentation

## 2017-11-10 DIAGNOSIS — Z79899 Other long term (current) drug therapy: Secondary | ICD-10-CM | POA: Insufficient documentation

## 2017-11-10 DIAGNOSIS — M25531 Pain in right wrist: Secondary | ICD-10-CM | POA: Insufficient documentation

## 2017-11-10 DIAGNOSIS — Y929 Unspecified place or not applicable: Secondary | ICD-10-CM | POA: Insufficient documentation

## 2017-11-10 DIAGNOSIS — M25511 Pain in right shoulder: Secondary | ICD-10-CM | POA: Diagnosis not present

## 2017-11-10 DIAGNOSIS — Y939 Activity, unspecified: Secondary | ICD-10-CM | POA: Insufficient documentation

## 2017-11-10 DIAGNOSIS — M79601 Pain in right arm: Secondary | ICD-10-CM | POA: Insufficient documentation

## 2017-11-10 MED ORDER — NAPROXEN 500 MG PO TABS
500.0000 mg | ORAL_TABLET | Freq: Once | ORAL | Status: AC
Start: 2017-11-10 — End: 2017-11-10
  Administered 2017-11-10: 500 mg via ORAL
  Filled 2017-11-10: qty 1

## 2017-11-10 MED ORDER — TRAMADOL HCL 50 MG PO TABS
50.0000 mg | ORAL_TABLET | Freq: Once | ORAL | Status: AC
  Administered 2017-11-10: 50 mg via ORAL
  Filled 2017-11-10: qty 1

## 2017-11-10 MED ORDER — NAPROXEN 500 MG PO TABS: 500.0000 mg | ORAL_TABLET | Freq: Two times a day (BID) | ORAL | Status: DC

## 2017-11-10 MED ORDER — TRAMADOL HCL 50 MG PO TABS: 50.0000 mg | ORAL_TABLET | Freq: Four times a day (QID) | ORAL | 0 refills | Status: AC | PRN

## 2017-11-10 NOTE — ED Provider Notes (Signed)
[facility] Emergency Department Provider Note   ____________________________________________   First MD Initiated Contact with Patient 11/10/17 1141     (approximate)  I have reviewed the triage vital signs and the nursing notes.   HISTORY  Chief Complaint Arm Injury    HPI Valerie Mckinney is a 37 y.o. female right arm pain secondary to a fall which occurred 2 days ago.  Patient pain extends from the shoulder to the wrist.  Patient denies loss of sensation but states pain with movement.  No elicited measured for complaint.  Patient rates pain as a 10/10.  Patient described the pain as "throbbing".  Past Medical History:  Diagnosis Date  . Allergy   . Anemia   . Broken toe 04/2016   broke outer toes on right foot, went to ED and is wearing a boot  . GERD (gastroesophageal reflux disease)   . 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 Seizures [facility])     Patient Active Problem List   Diagnosis Date Noted  . DJD (degenerative joint disease) of knee 11/16/2015  . Neurofibromatosis ([place]) 09/16/2015  . 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 Bilateral occipital neuralgia 09/16/2015  . Migraine 09/16/2015    Past Surgical History:  Procedure Laterality Date  . BACK SURGERY     tumor removed  . BREAST SURGERY Right    benign tumor removed  . CHOLECYSTECTOMY    . EXPLORATORY LAPAROTOMY    . FRACTURE SURGERY Right    ankle  . HAND SURGERY Right    tumor removed  . OTHER SURGICAL HISTORY Right    arm (3 tumors removed)  . TUBAL LIGATION      Prior to Admission medications   Medication Sig Start Date End Date Taking? Authorizing Provider  gabapentin (NEURONTIN) 100 MG capsule Take 300 mg by mouth 3 (three) times daily.    [provider]  ibuprofen (ADVIL,MOTRIN) 600 MG tablet Take 1 tablet (600 mg total) by mouth every 6 (six) hours as needed. 03/30/17   Sable Feil, PA-C  levETIRAcetam (KEPPRA) 500 MG tablet Take 1,500  mg by mouth 2 (two) times daily.     [provider]  naproxen (NAPROSYN) 500 MG tablet Take 1 tablet (500 mg total) by mouth 2 (two) times daily with a meal. 11/10/17   Sable Feil, PA-C  nortriptyline (PAMELOR) 10 MG capsule Take 20 mg by mouth at bedtime.  08/18/15   [provider]  oxyCODONE (ROXICODONE) 5 MG immediate release tablet Take 1 tablet (5 mg total) by mouth 2 (two) times daily. 07/16/17   Molli Barrows, MD  topiramate (TOPAMAX) 15 MG capsule Take 15 mg by mouth at bedtime.    [provider]  traMADol (ULTRAM) 50 MG tablet Take 1 tablet (50 mg total) by mouth every 6 (six) hours as needed. 11/10/17 11/10/18  Sable Feil, PA-C    Allergies Flagyl [metronidazole]; Zofran [ondansetron hcl]; Amitriptyline; Benadryl [diphenhydramine]; and Tylenol [acetaminophen]  Family History  Problem Relation Age of Onset  . Heart disease Mother   . Cancer Father   . Neurofibromatosis Brother   . Sickle cell trait Brother     Social History Social History   Tobacco Use  . Smoking status: Current Every Day Smoker    Packs/day: 0.50    Types: Cigarettes  . Smokeless tobacco: Never Used  Substance Use Topics  . Alcohol use: No    Alcohol/week: 0.0 oz  . Drug use:  No    Review of Systems  Constitutional: No fever/chills Eyes: No visual changes. ENT: No sore throat. Cardiovascular: Denies chest pain. Respiratory: Denies shortness of breath. Gastrointestinal: No abdominal pain.  No nausea, no vomiting.  No diarrhea.  No constipation. Genitourinary: Negative for dysuria. Musculoskeletal: Right upper extremity pain Skin: Negative for rash. Neurological: Negative for headaches, focal weakness or numbness. Allergic/Immunilogical: See medication list ____________________________________________   PHYSICAL EXAM:  VITAL SIGNS: ED Triage Vitals  Enc Vitals Group     BP 11/10/17 1109 125/82     Pulse Rate 11/10/17 1109 83     Resp 11/10/17 1109 16      Temp 11/10/17 1109 98.8 F (37.1 C)     Temp Source 11/10/17 1109 Oral     SpO2 11/10/17 1109 99 %     Weight 11/10/17 1110 191 lb (86.6 kg)     Height 11/10/17 1110 5\' 5"  (1.651 m)     Head Circumference --      Peak Flow --      Pain Score 11/10/17 1110 10     Pain Loc --      Pain Edu? --      Excl. in [place]? --    Constitutional: Alert and oriented. Well appearing and in no acute distress. Eyes: Conjunctivae are normal. PERRL. EOMI. Head: Atraumatic. Cardiovascular: Normal rate, regular rhythm. Grossly normal heart sounds.  Good peripheral circulation. Respiratory: Normal respiratory effort.  No retractions. Lungs CTAB. Gastrointestinal: Soft and nontender. No distention. No abdominal bruits. No CVA tenderness. Musculoskeletal: No obvious deformity to the right upper extremity.  No obvious edema or ecchymosis or abrasions. Neurologic:  Normal speech and language. No gross focal neurologic deficits are appreciated. No gait instability. Skin:  Skin is warm, dry and intact. No rash noted. Psychiatric: Mood and affect are normal. Speech and behavior are normal.  ____________________________________________   LABS (all labs ordered are listed, but only abnormal results are displayed)  Labs Reviewed - No data to display ____________________________________________  EKG   ____________________________________________  RADIOLOGY  Dg Forearm Left  Result Date: 11/10/2017 CLINICAL DATA:  Pain after trauma EXAM: LEFT FOREARM - 2 VIEW COMPARISON:  None. FINDINGS: There appear to be 2 skin abnormalities which could be sequela of trauma or represent longstanding skin lesions. Recommend clinical correlation. No foreign body identified. No fractures are seen in the forearm. No joint effusion noted in the elbow. IMPRESSION: No acute bony abnormalities. Electronically Signed   By: Dorise Bullion III M.D   On: 11/10/2017 12:43   Dg Wrist Complete Left  Result Date: 11/10/2017 CLINICAL  DATA:  Pain after trauma EXAM: LEFT WRIST - COMPLETE 3+ VIEW COMPARISON:  None. FINDINGS: There is no evidence of fracture or dislocation. There is no evidence of arthropathy or other focal bone abnormality. Soft tissues are unremarkable. IMPRESSION: Negative. Electronically Signed   By: Dorise Bullion III M.D   On: 11/10/2017 12:44   Dg Shoulder Left  Result Date: 11/10/2017 CLINICAL DATA:  Pain after trauma 2 days ago. EXAM: LEFT SHOULDER - 2+ VIEW COMPARISON:  None. FINDINGS: There is no evidence of fracture or dislocation. There is no evidence of arthropathy or other focal bone abnormality. Soft tissues are unremarkable. IMPRESSION: Negative. Electronically Signed   By: Dorise Bullion III M.D   On: 11/10/2017 12:41    ____________________________________________   PROCEDURES  Procedure(s) performed: None  Procedures  Critical Care performed: No  ____________________________________________   INITIAL IMPRESSION / ASSESSMENT AND PLAN /  ED COURSE  As part of my medical decision making, I reviewed the following data within the [facility]    Right arm pain secondary to fall.  Discussed negative x-ray findings with patient.  Patient given discharge care instruction.  Patient placed in arm sling and advised to wear full 2-3 days as needed.  Take medication as directed.  Follow-up with PCP if complaints persist.      ____________________________________________   FINAL CLINICAL IMPRESSION(S) / ED DIAGNOSES  Final diagnoses:  Right arm pain     ED Discharge Orders        Ordered    naproxen (NAPROSYN) 500 MG tablet  2 times daily with meals     11/10/17 1320    traMADol (ULTRAM) 50 MG tablet  Every 6 hours PRN     11/10/17 1320       Note:  This document was prepared using Dragon voice recognition software and may include unintentional dictation errors.    Sable Feil, PA-C 11/10/17 1324    Nena Polio, MD 11/10/17 [phone]

## 2017-11-10 NOTE — ED Triage Notes (Signed)
Pt reports she fell Thursday night and is having pain in her left shoulder/arm/wrist

## 2017-11-10 NOTE — Discharge Instructions (Signed)
Arm sling for 3-5 days as needed.  Take medication as directed.

## 2018-10-21 ENCOUNTER — Other Ambulatory Visit: Payer: Self-pay | Admitting: Acute Care

## 2018-10-21 DIAGNOSIS — M542 Cervicalgia: Secondary | ICD-10-CM

## 2018-10-30 ENCOUNTER — Ambulatory Visit
Admission: RE | Admit: 2018-10-30 | Discharge: 2018-10-30 | Disposition: A | Discharge status: Home / Self Care | Payer: Medicare Other | Source: Ambulatory Visit | Attending: Acute Care | Admitting: Acute Care

## 2018-10-30 DIAGNOSIS — M542 Cervicalgia: Secondary | ICD-10-CM | POA: Insufficient documentation

## 2019-01-13 ENCOUNTER — Emergency Department
Admission: EM | Admit: 2019-01-13 | Discharge: 2019-01-13 | Disposition: A | Discharge status: Home / Self Care | Payer: Medicare Other | Attending: Emergency Medicine | Admitting: Emergency Medicine

## 2019-01-13 ENCOUNTER — Other Ambulatory Visit: Payer: Self-pay

## 2019-01-13 ENCOUNTER — Encounter: Payer: Self-pay | Admitting: Emergency Medicine

## 2019-01-13 ENCOUNTER — Emergency Department: Payer: Medicare Other

## 2019-01-13 DIAGNOSIS — N939 Abnormal uterine and vaginal bleeding, unspecified: Secondary | ICD-10-CM | POA: Diagnosis present

## 2019-01-13 DIAGNOSIS — F17210 Nicotine dependence, cigarettes, uncomplicated: Secondary | ICD-10-CM | POA: Insufficient documentation

## 2019-01-13 DIAGNOSIS — N83201 Unspecified ovarian cyst, right side: Secondary | ICD-10-CM | POA: Diagnosis not present

## 2019-01-13 DIAGNOSIS — D649 Anemia, unspecified: Secondary | ICD-10-CM | POA: Diagnosis not present

## 2019-01-13 DIAGNOSIS — Z3202 Encounter for pregnancy test, result negative: Secondary | ICD-10-CM | POA: Diagnosis not present

## 2019-01-13 LAB — TYPE AND SCREEN
ABO/RH(D): O POS
Antibody Screen: NEGATIVE

## 2019-01-13 LAB — COMPREHENSIVE METABOLIC PANEL
ALT: 13 U/L (ref 0–44)
AST: 19 U/L (ref 15–41)
Albumin: 4.1 g/dL (ref 3.5–5.0)
Alkaline Phosphatase: 57 U/L (ref 38–126)
Anion gap: 6 (ref 5–15)
BUN: 6 mg/dL (ref 6–20)
CO2: 21 mmol/L — AB (ref 22–32)
Calcium: 9.1 mg/dL (ref 8.9–10.3)
Chloride: 113 mmol/L — ABNORMAL HIGH (ref 98–111)
Creatinine, Ser: 0.6 mg/dL (ref 0.44–1.00)
GFR calc non Af Amer: >60 mL/min (ref >60)
Glucose, Bld: 115 mg/dL — ABNORMAL HIGH (ref 70–99)
Potassium: 4 mmol/L (ref 3.5–5.1)
SODIUM: 140 mmol/L (ref 135–145)
Total Bilirubin: 0.2 mg/dL — ABNORMAL LOW (ref 0.3–1.2)
Total Protein: 7.5 g/dL (ref 6.5–8.1)

## 2019-01-13 LAB — CBC
HCT: 31.2 % — ABNORMAL LOW (ref 36.0–46.0)
Hemoglobin: 9 g/dL — ABNORMAL LOW (ref 12.0–15.0)
MCH: 18.6 pg — ABNORMAL LOW (ref 26.0–34.0)
MCHC: 28.8 g/dL — ABNORMAL LOW (ref 30.0–36.0)
MCV: 64.3 fL — ABNORMAL LOW (ref 80.0–100.0)
Platelets: 284 10*3/uL (ref 150–400)
RBC: 4.85 MIL/uL (ref 3.87–5.11)
RDW: 19.3 % — ABNORMAL HIGH (ref 11.5–15.5)
WBC: 8.4 10*3/uL (ref 4.0–10.5)
nRBC: 0 % (ref 0.0–0.2)

## 2019-01-13 LAB — HCG, QUANTITATIVE, PREGNANCY: hCG, Beta Chain, Quant, S: <1 m[IU]/mL (ref <5)

## 2019-01-13 MED ORDER — OXYCODONE-ACETAMINOPHEN 5-325 MG PO TABS: 2.0000 | ORAL_TABLET | Freq: Once | ORAL | Status: DC

## 2019-01-13 MED ORDER — TRAMADOL HCL 50 MG PO TABS: 50.0000 mg | ORAL_TABLET | Freq: Four times a day (QID) | ORAL | 0 refills | Status: DC | PRN

## 2019-01-13 MED ORDER — FERROUS SULFATE DRIED ER 160 (50 FE) MG PO TBCR: 160.0000 mg | EXTENDED_RELEASE_TABLET | ORAL | 6 refills | Status: DC

## 2019-01-13 MED ORDER — MEDROXYPROGESTERONE ACETATE 10 MG PO TABS
10.0000 mg | ORAL_TABLET | Freq: Every day | ORAL | Status: DC
  Administered 2019-01-13: 10 mg via ORAL
  Filled 2019-01-13: qty 1

## 2019-01-13 MED ORDER — MEDROXYPROGESTERONE ACETATE 10 MG PO TABS: 10.0000 mg | ORAL_TABLET | Freq: Every day | ORAL | 0 refills | Status: DC

## 2019-01-13 MED ORDER — OXYCODONE HCL 5 MG PO TABS
10.0000 mg | ORAL_TABLET | Freq: Once | ORAL | Status: AC
  Administered 2019-01-13: 10 mg via ORAL
  Filled 2019-01-13: qty 2

## 2019-01-13 NOTE — ED Provider Notes (Signed)
[facility] Emergency Department Provider Note       Time seen: ----------------------------------------- 1:09 PM on 01/13/2019 -----------------------------------------   I have reviewed the triage vital signs and the nursing notes.  HISTORY   Chief Complaint Vaginal Bleeding    HPI Valerie Mckinney is a 38 y.o. female with a history of allergies, anemia, GERD, neurofibromatosis, seizures who presents to the ED for heavy vaginal bleeding for the past 2 weeks.  Patient states she is changing a pad about 3 times a day.  She states she is on Macrobid right now because they told her she may have a urinary tract infection.  She does report diffuse lower abdominal pain.  Nothing makes it better.  Past Medical History:  Diagnosis Date  . Allergy   . Anemia   . Broken toe 04/2016   broke outer toes on right foot, went to ED and is wearing a boot  . GERD (gastroesophageal reflux disease)   . 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 Seizures [facility])     Patient Active Problem List   Diagnosis Date Noted  . DJD (degenerative joint disease) of knee 11/16/2015  . Neurofibromatosis ([place]) 09/16/2015  . 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 Bilateral occipital neuralgia 09/16/2015  . Migraine 09/16/2015    Past Surgical History:  Procedure Laterality Date  . BACK SURGERY     tumor removed  . BREAST SURGERY Right    benign tumor removed  . CHOLECYSTECTOMY    . EXPLORATORY LAPAROTOMY    . FRACTURE SURGERY Right    ankle  . HAND SURGERY Right    tumor removed  . OTHER SURGICAL HISTORY Right    arm (3 tumors removed)  . TUBAL LIGATION      Allergies Flagyl [metronidazole]; Zofran [ondansetron hcl]; Amitriptyline; Benadryl [diphenhydramine]; and Tylenol [acetaminophen]  Social History Social History   Tobacco Use  . Smoking status: Current Every Day Smoker    Packs/day: 0.50    Types: Cigarettes  . Smokeless tobacco: Never Used   Substance Use Topics  . Alcohol use: No    Alcohol/week: 0.0 standard drinks  . Drug use: No   Review of Systems Constitutional: Negative for fever. Cardiovascular: Negative for chest pain. Respiratory: Negative for shortness of breath. Gastrointestinal: Positive for abdominal pain Genitourinary: Positive for vaginal bleeding Musculoskeletal: Negative for back pain. Skin: Negative for rash. Neurological: Negative for headaches, focal weakness or numbness.  All systems negative/normal/unremarkable except as stated in the HPI  ____________________________________________   PHYSICAL EXAM:  VITAL SIGNS: ED Triage Vitals  Enc Vitals Group     BP 01/13/19 1159 127/85     Pulse Rate 01/13/19 1159 93     Resp --      Temp 01/13/19 1159 98.6 F (37 C)     Temp Source 01/13/19 1159 Oral     SpO2 01/13/19 1159 100 %     Weight 01/13/19 1147 195 lb (88.5 kg)     Height 01/13/19 1147 5\' 5"  (1.651 m)     Head Circumference --      Peak Flow --      Pain Score 01/13/19 1147 10     Pain Loc --      Pain Edu? --      Excl. in [place]? --    Constitutional: Alert and oriented. Well appearing and in no distress. Eyes: Conjunctivae are normal. Normal extraocular movements. ENT      Head: Normocephalic and atraumatic.  Nose: No congestion/rhinnorhea.      Mouth/Throat: Mucous membranes are moist.      Neck: No stridor. Cardiovascular: Normal rate, regular rhythm. No murmurs, rubs, or gallops. Respiratory: Normal respiratory effort without tachypnea nor retractions. Breath sounds are clear and equal bilaterally. No wheezes/rales/rhonchi. Gastrointestinal: Mild lower abdominal tenderness, no rebound or guarding.  Normal bowel sounds. Musculoskeletal: Nontender with normal range of motion in extremities. No lower extremity tenderness nor edema. Neurologic:  Normal speech and language. No gross focal neurologic deficits are appreciated.  Skin:  Skin is warm, dry and intact. No rash  noted. Psychiatric: Mood and affect are normal. Speech and behavior are normal.  ____________________________________________  ED COURSE:  As part of my medical decision making, I reviewed the following data within the [place] History obtained from family if available, nursing notes, old chart and ekg, as well as notes from prior ED visits. Patient presented for vaginal bleeding, we will assess with labs and imaging as indicated at this time.   Procedures  LABS (pertinent positives/negatives)  Labs Reviewed  CBC - Abnormal; Notable for the following components:      Result Value   Hemoglobin 9.0 (*)    HCT 31.2 (*)    MCV 64.3 (*)    MCH 18.6 (*)    MCHC 28.8 (*)    RDW 19.3 (*)    All other components within normal limits  COMPREHENSIVE METABOLIC PANEL - Abnormal; Notable for the following components:   Chloride 113 (*)    CO2 21 (*)    Glucose, Bld 115 (*)    Total Bilirubin 0.2 (*)    All other components within normal limits  HCG, QUANTITATIVE, PREGNANCY  TYPE AND SCREEN    RADIOLOGY  Pelvic ultrasound IMPRESSION: 3.3 cm predominately simple appearing right ovarian cyst likely a follicular cyst. Normal uterus and endometrium. Normal left ovary.  Ovaries within normal ____________________________________________   DIFFERENTIAL DIAGNOSIS   Abnormal vaginal bleeding, menorrhagia, anemia, fibroids  FINAL ASSESSMENT AND PLAN  Abnormal vaginal bleeding   Plan: The patient had presented for abnormal vaginal bleeding. Patient's labs did reveal an anemia although not severe. Patient's imaging did not reveal any acute process, she has a right ovarian cyst.  She will be discharged with Provera, iron and tramadol.  She is cleared for outpatient follow-up.   Laurence Aly, MD    Note: This note was generated in part or whole with voice recognition software. Voice recognition is usually quite accurate but there are transcription errors that  can and very often do occur. I apologize for any typographical errors that were not detected and corrected.     Earleen Newport, MD 01/13/19 [phone]

## 2019-01-13 NOTE — ED Triage Notes (Signed)
Pt reports has had vaginal bleeding for the past 2 weeks. Pt states she went to [facility] on Saturday and was told that she had an infection and her blood count was at 2.7. Pt reports moderate bleeding and states intermittent pain to abd.

## 2019-03-20 ENCOUNTER — Emergency Department
Admission: EM | Admit: 2019-03-20 | Discharge: 2019-03-20 | Disposition: A | Discharge status: Home / Self Care | Payer: Medicare Other | Attending: Emergency Medicine | Admitting: Emergency Medicine

## 2019-03-20 ENCOUNTER — Encounter: Payer: Self-pay | Admitting: *Deleted

## 2019-03-20 ENCOUNTER — Other Ambulatory Visit: Payer: Self-pay

## 2019-03-20 DIAGNOSIS — Z79899 Other long term (current) drug therapy: Secondary | ICD-10-CM | POA: Diagnosis not present

## 2019-03-20 DIAGNOSIS — F17210 Nicotine dependence, cigarettes, uncomplicated: Secondary | ICD-10-CM | POA: Diagnosis not present

## 2019-03-20 DIAGNOSIS — K0889 Other specified disorders of teeth and supporting structures: Secondary | ICD-10-CM | POA: Diagnosis present

## 2019-03-20 MED ORDER — KETOROLAC TROMETHAMINE 30 MG/ML IJ SOLN
30.0000 mg | Freq: Once | INTRAMUSCULAR | Status: AC
  Administered 2019-03-20: 30 mg via INTRAMUSCULAR
  Filled 2019-03-20: qty 1

## 2019-03-20 MED ORDER — KETOROLAC TROMETHAMINE 10 MG PO TABS: 10.0000 mg | ORAL_TABLET | Freq: Four times a day (QID) | ORAL | 0 refills | Status: AC | PRN

## 2019-03-20 MED ORDER — TRAMADOL HCL 50 MG PO TABS: 50.0000 mg | ORAL_TABLET | Freq: Four times a day (QID) | ORAL | 0 refills | Status: AC | PRN

## 2019-03-20 MED ORDER — TRAMADOL HCL 50 MG PO TABS
50.0000 mg | ORAL_TABLET | Freq: Once | ORAL | Status: AC
  Administered 2019-03-20: 50 mg via ORAL
  Filled 2019-03-20: qty 1

## 2019-03-20 NOTE — Discharge Instructions (Signed)
OPTIONS FOR DENTAL FOLLOW UP CARE ° °[place] [facility] and [facility] - Local Safety Net Dental Clinics °http://www.ncdhhs.gov/dph/oralhealth/services/safetynetclinics.htm °  °[facility] (336-562-3123) ° °[facility] [place] ([phone]) ° °[facility] ([phone] ext [phone]) ° °[facility] ([phone]) ° °[facility] ([phone]) °This clinic caters to the indigent population and is on a lottery system. °Location: °[facility], [address], [place] °Clinic Hours: °Wednesdays from 6pm - 9pm, patients seen by a lottery system. °For dates, call or go to www.med.[facility].edu/[facility]/patients/Dental-[facility] °Services: °Cleanings, fillings and simple extractions. °Payment Options: °DENTAL WORK IS FREE OF CHARGE. Bring proof of income or support. °Best way to get seen: °Arrive at 5:15 pm - this is a lottery, NOT first come/first serve, so arriving earlier will not increase your chances of being seen. °  °  °[facility] °[phone] °Select option 1 for emergencies °  °Location: °[facility], [address], [place] °Clinic Hours: °No walk-ins accepted - call the day before to schedule an appointment. °Check in times are 9:30 am and 1:30 pm. °Services: °Simple extractions, temporary fillings, pulpectomy/pulp debridement, uncomplicated abscess drainage. °Payment Options: °PAYMENT IS DUE AT THE TIME OF SERVICE.  Fee is usually $100-200, additional surgical procedures (e.g. abscess drainage) may be extra. °Cash, checks, Visa/MasterCard accepted.  Can file Medicaid if patient is covered for dental - patient should call case worker to check. °No discount for [facility] patients. °Best way to get seen: °MUST call the day before and get onto the schedule. Can usually be seen the next 1-2 days. No walk-ins accepted. °  °  °[facility] °[phone] °   °Location: °[facility], [address], [place] °Clinic Hours: °M, W, Th, F 8am or 1:30pm, Tues 9a or 1:30 - first come/first served. °Services: °Simple extractions, temporary fillings, uncomplicated abscess drainage.  You do not need to be an [place] resident. °Payment Options: °PAYMENT IS DUE AT THE TIME OF SERVICE. °Dental insurance, otherwise sliding scale - bring proof of income or support. °Depending on income and treatment needed, cost is usually $50-200. °Best way to get seen: °Arrive early as it is first come/first served. °  °  °[facility] °[phone] °  °Location: °[address] °Clinic Hours: °Mon-Thu 8a-5p °Services: °Most basic dental services including extractions and fillings. °Payment Options: °PAYMENT IS DUE AT THE TIME OF SERVICE. °Sliding scale, up to 50% off - bring proof if income or support. °Medicaid with dental option accepted. °Best way to get seen: °Call to schedule an appointment, can usually be seen within 2 weeks OR they will try to see walk-ins - show up at 8a or 2p (you may have to wait). °  °  °[facility] °[phone] °[place] RESIDENTS ONLY °  °Location: °[facility], [address], [place], [place] [address] °Clinic Hours: By appointment only. °Monday - Thursday 8am-5pm, Friday 8am-12pm °Services: Cleanings, fillings, extractions. °Payment Options: °PAYMENT IS DUE AT THE TIME OF SERVICE. °Cash, Visa or MasterCard. Sliding scale - $30 minimum per service. °Best way to get seen: °Come in to office, complete packet and make an appointment - need proof of income °or support monies for each household member and proof of [place] residence. °Usually takes about a month to get in. °  °  °[facility] °[phone] °  °Location: °[address]   [place] °Clinic Hours: Walk-in Urgent Care Dental Services are offered Monday-Friday  mornings only. °The numbers of emergencies accepted daily is limited to the number of °providers available. °Maximum 15 - Mondays, Wednesdays & Thursdays °Maximum 10 - Tuesdays & Fridays °Services: °You do not need to be a [place] resident to be seen for a dental emergency. °Emergencies are defined as pain, swelling, abnormal bleeding, or dental trauma. Walkins will receive x-rays if needed. °NOTE: Dental cleaning is not an emergency. °Payment Options: °PAYMENT IS DUE AT THE TIME OF SERVICE. °Minimum co-pay is $40.00 for uninsured patients. °Minimum co-pay is $3.00 for Medicaid with dental coverage. °Dental Insurance is accepted and must be presented at time of visit. °Medicare does not cover dental. °Forms of payment: Cash, credit card, checks. °Best way to get seen: °If not previously registered with the clinic, walk-in dental registration begins at 7:15 am and is on a first come/first serve basis. °If previously registered with the clinic, call to make an appointment. °  °  °[facility] °[phone] °[place] RESIDENTS ONLY °  °Location: °[address], [place], [place] °Clinic Hours: °Mon-Thu 10a-2p °Services: Extractions only! °Payment Options: °FREE (donations accepted) - bring proof of income or support °Best way to get seen: °Call and schedule an appointment OR come at 8am on the 1st Monday of every month (except for holidays) when it is first come/first served. °  °  °[facility] °[phone] °  °Location: °[address], [place] °Clinic Hours: °Friday mornings °Services, Payment Options, Best way to get seen: °Call for info °

## 2019-03-20 NOTE — ED Provider Notes (Signed)
[facility] Emergency Department Provider Note  ____________________________________________  Time seen: Approximately 5:29 PM  I have reviewed the triage vital signs and the nursing notes.   HISTORY  Chief Complaint Dental Pain    HPI Valerie Mckinney is a 38 y.o. female presents to the emergency department with a broken superior 14 and swelling of the left upper jaw that has occurred over the the past 4 to 5 days.  Patient reports that she has made an appointment with a local dentist and has an appointment for Thursday.  Patient states that she has been taking Aleve at home but Aleve is no longer managing her pain.  Patient presents to the emergency department for pain management.  She denies fever, chills, difficulty swallowing or pain underneath the tongue.  No other alleviating measures have been attempted.        Past Medical History:  Diagnosis Date  . Allergy   . Anemia   . Broken toe 04/2016   broke outer toes on right foot, went to ED and is wearing a boot  . GERD (gastroesophageal reflux disease)   . 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 Seizures [facility])     Patient Active Problem List   Diagnosis Date Noted  . DJD (degenerative joint disease) of knee 11/16/2015  . Neurofibromatosis ([place]) 09/16/2015  . 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 Bilateral occipital neuralgia 09/16/2015  . Migraine 09/16/2015    Past Surgical History:  Procedure Laterality Date  . BACK SURGERY     tumor removed  . BREAST SURGERY Right    benign tumor removed  . CHOLECYSTECTOMY    . EXPLORATORY LAPAROTOMY    . FRACTURE SURGERY Right    ankle  . HAND SURGERY Right    tumor removed  . OTHER SURGICAL HISTORY Right    arm (3 tumors removed)  . TUBAL LIGATION      Prior to Admission medications   Medication Sig Start Date End Date Taking? Authorizing Provider  ferrous sulfate (EQL SLOW RELEASE IRON) 160 (50 Fe) MG TBCR SR tablet Take 1  tablet (160 mg total) by mouth every other day. 01/13/19   Earleen Newport, MD  gabapentin (NEURONTIN) 100 MG capsule Take 300 mg by mouth 3 (three) times daily.    [provider]  ibuprofen (ADVIL,MOTRIN) 600 MG tablet Take 1 tablet (600 mg total) by mouth every 6 (six) hours as needed. 03/30/17   Sable Feil, PA-C  ketorolac (TORADOL) 10 MG tablet Take 1 tablet (10 mg total) by mouth every 6 (six) hours as needed for up to 5 days. 03/20/19 03/25/19  Lannie Fields, PA-C  levETIRAcetam (KEPPRA) 500 MG tablet Take 1,500 mg by mouth 2 (two) times daily.     [provider]  medroxyPROGESTERone (PROVERA) 10 MG tablet Take 1 tablet (10 mg total) by mouth daily. 01/13/19   Earleen Newport, MD  naproxen (NAPROSYN) 500 MG tablet Take 1 tablet (500 mg total) by mouth 2 (two) times daily with a meal. 11/10/17   Sable Feil, PA-C  nortriptyline (PAMELOR) 10 MG capsule Take 20 mg by mouth at bedtime.  08/18/15   [provider]  oxyCODONE (ROXICODONE) 5 MG immediate release tablet Take 1 tablet (5 mg total) by mouth 2 (two) times daily. 07/16/17   Molli Barrows, MD  topiramate (TOPAMAX) 15 MG capsule Take 15 mg by mouth at bedtime.    [provider]  traMADol Veatrice Bourbon) 50  MG tablet Take 1 tablet (50 mg total) by mouth every 6 (six) hours as needed for up to 3 days. 03/20/19 03/23/19  Lannie Fields, PA-C    Allergies Flagyl [metronidazole]; Zofran [ondansetron hcl]; Amitriptyline; Benadryl [diphenhydramine]; and Tylenol [acetaminophen]  Family History  Problem Relation Age of Onset  . Heart disease Mother   . Cancer Father   . Neurofibromatosis Brother   . Sickle cell trait Brother     Social History Social History   Tobacco Use  . Smoking status: Current Every Day Smoker    Packs/day: 0.50    Types: Cigarettes  . Smokeless tobacco: Never Used  Substance Use Topics  . Alcohol use: No    Alcohol/week: 0.0 standard drinks  . Drug use: No      Review of Systems  Constitutional: No fever/chills Eyes: No visual changes. No discharge ENT: Patient has Superior 14 pain  Cardiovascular: no chest pain. Respiratory: no cough. No SOB. Gastrointestinal: No abdominal pain.  No nausea, no vomiting.  No diarrhea.  No constipation. Musculoskeletal: Negative for musculoskeletal pain. Skin: Negative for rash, abrasions, lacerations, ecchymosis. Neurological: Negative for headaches, focal weakness or numbness.   ____________________________________________   PHYSICAL EXAM:  VITAL SIGNS: ED Triage Vitals  Enc Vitals Group     BP 03/20/19 1513 134/84     Pulse Rate 03/20/19 1513 (!) 102     Resp 03/20/19 1513 18     Temp 03/20/19 1513 98.8 F (37.1 C)     Temp Source 03/20/19 1513 Oral     SpO2 03/20/19 1513 98 %     Weight 03/20/19 1514 190 lb (86.2 kg)     Height 03/20/19 1514 5\' 5"  (1.651 m)     Head Circumference --      Peak Flow --      Pain Score 03/20/19 1514 10     Pain Loc --      Pain Edu? --      Excl. in [place]? --      Constitutional: Alert and oriented. Well appearing and in no acute distress. Eyes: Conjunctivae are normal. PERRL. EOMI. Head: Atraumatic. ENT:      Ears:       Nose: No congestion/rhinnorhea.      Mouth/Throat: Mucous membranes are moist.  Patient has broken superior 14.  Mild swelling of the left upper jaw visualized.  No pain to palpation underneath the tongue. Neck: No stridor.  No cervical spine tenderness to palpation. Cardiovascular: Normal rate, regular rhythm. Normal S1 and S2.  Good peripheral circulation. Respiratory: Normal respiratory effort without tachypnea or retractions. Lungs CTAB. Good air entry to the bases with no decreased or absent breath sounds. Skin:  Skin is warm, dry and intact. No rash noted. Psychiatric: Mood and affect are normal. Speech and behavior are normal. Patient exhibits appropriate insight and judgement.   ____________________________________________    LABS (all labs ordered are listed, but only abnormal results are displayed)  Labs Reviewed - No data to display ____________________________________________  EKG   ____________________________________________  RADIOLOGY  No results found.  ____________________________________________    PROCEDURES  Procedure(s) performed:    Procedures    Medications  traMADol (ULTRAM) tablet 50 mg (50 mg Oral Given 03/20/19 1735)  ketorolac (TORADOL) 30 MG/ML injection 30 mg (30 mg Intramuscular Given 03/20/19 1734)     ____________________________________________   INITIAL IMPRESSION / ASSESSMENT AND PLAN / ED COURSE  Pertinent labs & imaging results that were available during my care of  the patient were reviewed by me and considered in my medical decision making (see chart for details).  Review of the [place] CSRS was performed in accordance of the [place] prior to dispensing any controlled drugs.         Assessment and plan Dental pain 38 year old female presents to the emergency department with pain from a broken superior 14 and swelling of the left upper jaw.  Patient was given Toradol and tramadol in the emergency department.  She was discharged with Toradol, tramadol and amoxicillin.  She was advised to keep appointment with local dentist.  Patient was under the impression that she could receive a dental extraction in the emergency department I explained to patient that we do not extract teeth in the emergency department.  She voiced understanding.  All patient questions were answered.  ____________________________________________  FINAL CLINICAL IMPRESSION(S) / ED DIAGNOSES  Final diagnoses:  Pain, dental      NEW MEDICATIONS STARTED DURING THIS VISIT:  ED Discharge Orders         Ordered    ketorolac (TORADOL) 10 MG tablet  Every 6 hours PRN     03/20/19 1743    traMADol (ULTRAM) 50 MG tablet  Every 6 hours PRN     03/20/19 1743              This chart was  dictated using voice recognition software/Dragon. Despite best efforts to proofread, errors can occur which can change the meaning. Any change was purely unintentional.    Lannie Fields, PA-C 03/20/19 1745    Nance Pear, MD 03/20/19 [phone]

## 2019-03-20 NOTE — ED Triage Notes (Signed)
Pt states left upper tooth broke 4 days ago and pt has pain.  Pt alert.

## 2019-03-24 ENCOUNTER — Other Ambulatory Visit: Payer: Self-pay

## 2019-03-24 ENCOUNTER — Emergency Department
Admission: EM | Admit: 2019-03-24 | Discharge: 2019-03-24 | Disposition: A | Discharge status: Home / Self Care | Payer: Medicare Other | Attending: Emergency Medicine | Admitting: Emergency Medicine

## 2019-03-24 ENCOUNTER — Encounter: Payer: Self-pay | Admitting: Emergency Medicine

## 2019-03-24 DIAGNOSIS — K0889 Other specified disorders of teeth and supporting structures: Secondary | ICD-10-CM | POA: Insufficient documentation

## 2019-03-24 DIAGNOSIS — Z79899 Other long term (current) drug therapy: Secondary | ICD-10-CM | POA: Diagnosis not present

## 2019-03-24 DIAGNOSIS — F17210 Nicotine dependence, cigarettes, uncomplicated: Secondary | ICD-10-CM | POA: Insufficient documentation

## 2019-03-24 DIAGNOSIS — D509 Iron deficiency anemia, unspecified: Secondary | ICD-10-CM | POA: Insufficient documentation

## 2019-03-24 DIAGNOSIS — R42 Dizziness and giddiness: Secondary | ICD-10-CM

## 2019-03-24 DIAGNOSIS — K047 Periapical abscess without sinus: Secondary | ICD-10-CM | POA: Diagnosis not present

## 2019-03-24 LAB — CBC
HCT: 31.1 % — ABNORMAL LOW (ref 36.0–46.0)
Hemoglobin: 8.8 g/dL — ABNORMAL LOW (ref 12.0–15.0)
MCH: 17.4 pg — ABNORMAL LOW (ref 26.0–34.0)
MCHC: 28.3 g/dL — ABNORMAL LOW (ref 30.0–36.0)
MCV: 61.5 fL — ABNORMAL LOW (ref 80.0–100.0)
Platelets: 210 10*3/uL (ref 150–400)
RBC: 5.06 MIL/uL (ref 3.87–5.11)
RDW: 19.9 % — ABNORMAL HIGH (ref 11.5–15.5)
WBC: 7.3 10*3/uL (ref 4.0–10.5)
nRBC: 0 % (ref 0.0–0.2)

## 2019-03-24 LAB — BASIC METABOLIC PANEL
Anion gap: 8 (ref 5–15)
BUN: 8 mg/dL (ref 6–20)
CO2: 20 mmol/L — ABNORMAL LOW (ref 22–32)
Calcium: 8.9 mg/dL (ref 8.9–10.3)
Chloride: 109 mmol/L (ref 98–111)
Creatinine, Ser: 0.57 mg/dL (ref 0.44–1.00)
GFR calc Af Amer: >60 mL/min (ref >60)
GFR calc non Af Amer: >60 mL/min (ref >60)
Glucose, Bld: 100 mg/dL — ABNORMAL HIGH (ref 70–99)
Potassium: 3.6 mmol/L (ref 3.5–5.1)
Sodium: 137 mmol/L (ref 135–145)

## 2019-03-24 LAB — POCT PREGNANCY, URINE: Preg Test, Ur: NEGATIVE

## 2019-03-24 MED ORDER — SODIUM CHLORIDE 0.9% FLUSH
3.0000 mL | Freq: Once | INTRAVENOUS | Status: AC
  Administered 2019-03-24: 17:00:00 3 mL via INTRAVENOUS

## 2019-03-24 MED ORDER — OXYCODONE HCL 5 MG PO TABS: 5.0000 mg | ORAL_TABLET | ORAL | 0 refills | Status: DC | PRN

## 2019-03-24 MED ORDER — SODIUM CHLORIDE 0.9 % IV BOLUS
1000.0000 mL | Freq: Once | INTRAVENOUS | Status: AC
  Administered 2019-03-24: 1000 mL via INTRAVENOUS

## 2019-03-24 MED ORDER — AMOXICILLIN 500 MG PO CAPS: 500.0000 mg | ORAL_CAPSULE | Freq: Three times a day (TID) | ORAL | 0 refills | Status: DC

## 2019-03-24 MED ORDER — AMOXICILLIN 500 MG PO CAPS
500.0000 mg | ORAL_CAPSULE | Freq: Once | ORAL | Status: AC
  Administered 2019-03-24: 500 mg via ORAL
  Filled 2019-03-24: qty 1

## 2019-03-24 NOTE — ED Notes (Signed)
Spoke with MD Quentin Cornwall about pt presentation, per MD appropriate for flex care

## 2019-03-24 NOTE — ED Notes (Signed)
See triage note  Presents via EMS from home states she became dizzy today   States she was seen last week for cracked tooth  States pain is getting worse  Not able to eat or sleep

## 2019-03-24 NOTE — Discharge Instructions (Addendum)
Follow-up with your regular doctor.  Please call for appointment.  Follow-up with 1 of the following dental clinics to have the tooth repaired.  Follow-up with your regular doctor about your anemia.  OPTIONS FOR DENTAL FOLLOW UP CARE  [facility] [email or web address] [phone])  [facility] [phone])  [place] [phone])  [place] [phone])  [facility] [phone]) This clinic caters to the indigent population and is on a lottery system. Location: [employer] [employer] [facility], [place], [place], [place] Clinic Hours: Wednesdays from 6pm - 9pm, patients seen by a lottery system. For dates, call or go to [email or web address] Services: Cleanings, fillings and simple extractions. Payment Options: DENTAL WORK IS FREE OF CHARGE. Bring proof of income or support. Best way to get seen: Arrive at 5:15 pm - this is a lottery, NOT first come/first serve, so arriving earlier will not increase your chances of being seen.     [facility] [phone] Select option 1 for emergencies   Location: [facility], [place], [address], [place] Clinic Hours: No walk-ins accepted - call the day before to schedule an appointment. Check in times are 9:30 am and 1:30 pm. Services: Simple extractions, temporary fillings, pulpectomy/pulp debridement, uncomplicated abscess drainage. Payment Options: PAYMENT IS DUE AT THE TIME OF SERVICE.  Fee is usually $100-200, additional surgical procedures (e.g. abscess drainage) may be extra. Cash, checks, Visa/MasterCard accepted.  Can file Medicaid if patient is covered for dental - patient should call case worker to check. No discount for [facility]  patients. Best way to get seen: MUST call the day before and get onto the schedule. Can usually be seen the next 1-2 days. No walk-ins accepted.     [place] [phone]   Location: [place], [place] Clinic Hours: M, W, Th, F 8am or 1:30pm, Tues 9a or 1:30 - first come/first served. Services: Simple extractions, temporary fillings, uncomplicated abscess drainage.  You do not need to be an [facility] resident. Payment Options: PAYMENT IS DUE AT THE TIME OF SERVICE. Dental insurance, otherwise sliding scale - bring proof of income or support. Depending on income and treatment needed, cost is usually $50-200. Best way to get seen: Arrive early as it is first come/first served.     [facility] [phone]   Location: [place] [facility] Hours: Mon-Thu 8a-5p Services: Most basic dental services including extractions and fillings. Payment Options: PAYMENT IS DUE AT THE TIME OF SERVICE. Sliding scale, up to 50% off - bring proof if income or support. Medicaid with dental option accepted. Best way to get seen: Call to schedule an appointment, can usually be seen within 2 weeks OR they will try to see walk-ins - show up at [place] or 2p (you may have to wait).     [facility] [place] RESIDENTS ONLY   Location: [facility], [place] [address], [place], [place] [address] Clinic Hours: By appointment only. Monday - Thursday 8am-5pm, Friday 8am-12pm Services: Cleanings, fillings, extractions. Payment Options: PAYMENT IS DUE AT THE TIME OF SERVICE. Cash, Visa or MasterCard. Sliding scale - $30 minimum per service. Best way to get seen: Come in to office, complete packet and make an appointment - need proof of income or support monies  for each household member and proof of [facility] residence. Usually takes about a month to  get in.     [facility] [phone]   Location: [address] [place] Clinic Hours: Walk-in Urgent Care Dental Services are offered Monday-Friday mornings only. The numbers of emergencies accepted daily is limited to the number of providers available. Maximum 15 - Mondays, Wednesdays & Thursdays Maximum 10 - Tuesdays & Fridays Services: You do not need to be a [facility] resident to be seen for a dental emergency. Emergencies are defined as pain, swelling, abnormal bleeding, or dental trauma. Walkins will receive x-rays if needed. NOTE: Dental cleaning is not an emergency. Payment Options: PAYMENT IS DUE AT THE TIME OF SERVICE. Minimum co-pay is $40.00 for uninsured patients. Minimum co-pay is $3.00 for Medicaid with dental coverage. Dental Insurance is accepted and must be presented at time of visit. Medicare does not cover dental. Forms of payment: Cash, credit card, checks. Best way to get seen: If not previously registered with the clinic, walk-in dental registration begins at 7:15 am and is on a first come/first serve basis. If previously registered with the clinic, call to make an appointment.     [facility] [place] ONLY   Location: [address] [place], [place] Clinic Hours: Mon-Thu 10a-2p Services: Extractions only! Payment Options: FREE (donations accepted) - bring proof of income or support Best way to get seen: Call and schedule an appointment OR come at 8am on the 1st Monday of every month (except for holidays) when it is first come/first served.     [facility] [phone]   Location: [place], [place] Clinic Hours: Friday mornings Services, Payment Options, Best way to get seen: Call for info

## 2019-03-24 NOTE — ED Notes (Signed)
PT states she does not think taking tramadol has caused her dizziness, she has taken medication in the past.

## 2019-03-24 NOTE — ED Triage Notes (Signed)
Pt from home, recently seen for tooth abscess and was given RX for tramadol and toradol. PT states no relief from pain, dizziness and feeling nauseated. VSS. R&KS8

## 2019-03-24 NOTE — ED Provider Notes (Signed)
[facility] Emergency Department Provider Note  ____________________________________________   First MD Initiated Contact with Patient 03/24/19 1619     (approximate)  I have reviewed the triage vital signs and the nursing notes.   HISTORY  Chief Complaint Dental Pain and Dizziness    HPI Valerie Mckinney is a 38 y.o. female presents emergency department complaining of dizziness.  She states she also has tooth pain.  She has been taking tramadol and Toradol for the pain.  She came here the other day-we will pull her tooth and was told we do not do that.  She was given a dose of amoxicillin while she was here.  She states she did not have a prescription at discharge.  She states she felt so dizzy today she had to call EMS.    Past Medical History:  Diagnosis Date  . Allergy   . Anemia   . Broken toe 04/2016   broke outer toes on right foot, went to ED and is wearing a boot  . GERD (gastroesophageal reflux disease)   . 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 Seizures [facility])     Patient Active Problem List   Diagnosis Date Noted  . DJD (degenerative joint disease) of knee 11/16/2015  . Neurofibromatosis ([place]) 09/16/2015  . 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 Bilateral occipital neuralgia 09/16/2015  . Migraine 09/16/2015    Past Surgical History:  Procedure Laterality Date  . BACK SURGERY     tumor removed  . BREAST SURGERY Right    benign tumor removed  . CHOLECYSTECTOMY    . EXPLORATORY LAPAROTOMY    . FRACTURE SURGERY Right    ankle  . HAND SURGERY Right    tumor removed  . OTHER SURGICAL HISTORY Right    arm (3 tumors removed)  . TUBAL LIGATION      Prior to Admission medications   Medication Sig Start Date End Date Taking? Authorizing Provider  amoxicillin (AMOXIL) 500 MG capsule Take 1 capsule (500 mg total) by mouth 3 (three) times daily. 03/24/19   Kacey Vicuna, [place] Dolin, PA-C  ferrous sulfate (EQL SLOW RELEASE IRON) 160  (50 Fe) MG TBCR SR tablet Take 1 tablet (160 mg total) by mouth every other day. 01/13/19   Earleen Newport, MD  gabapentin (NEURONTIN) 100 MG capsule Take 300 mg by mouth 3 (three) times daily.    [provider]  ibuprofen (ADVIL,MOTRIN) 600 MG tablet Take 1 tablet (600 mg total) by mouth every 6 (six) hours as needed. 03/30/17   Sable Feil, PA-C  ketorolac (TORADOL) 10 MG tablet Take 1 tablet (10 mg total) by mouth every 6 (six) hours as needed for up to 5 days. 03/20/19 03/25/19  Lannie Fields, PA-C  levETIRAcetam (KEPPRA) 500 MG tablet Take 1,500 mg by mouth 2 (two) times daily.     [provider]  medroxyPROGESTERone (PROVERA) 10 MG tablet Take 1 tablet (10 mg total) by mouth daily. 01/13/19   Earleen Newport, MD  naproxen (NAPROSYN) 500 MG tablet Take 1 tablet (500 mg total) by mouth 2 (two) times daily with a meal. 11/10/17   Sable Feil, PA-C  nortriptyline (PAMELOR) 10 MG capsule Take 20 mg by mouth at bedtime.  08/18/15   [provider]  oxyCODONE (OXY IR/ROXICODONE) 5 MG immediate release tablet Take 1 tablet (5 mg total) by mouth every 4 (four) hours as needed for severe pain. 03/24/19   Versie Starks, PA-C  topiramate (TOPAMAX) 15 MG capsule Take 15 mg by mouth at bedtime.    [provider]    Allergies Flagyl [metronidazole]; Zofran [ondansetron hcl]; Amitriptyline; Benadryl [diphenhydramine]; and Tylenol [acetaminophen]  Family History  Problem Relation Age of Onset  . Heart disease Mother   . Cancer Father   . Neurofibromatosis Brother   . Sickle cell trait Brother     Social History Social History   Tobacco Use  . Smoking status: Current Every Day Smoker    Packs/day: 0.50    Types: Cigarettes  . Smokeless tobacco: Never Used  Substance Use Topics  . Alcohol use: No    Alcohol/week: 0.0 standard drinks  . Drug use: No    Review of Systems  Constitutional: No fever/chills, dizziness Eyes: No visual changes.  ENT: No sore throat. Respiratory: Denies cough Genitourinary: Negative for dysuria. Musculoskeletal: Negative for back pain. Skin: Negative for rash.    ____________________________________________   PHYSICAL EXAM:  VITAL SIGNS: ED Triage Vitals  Enc Vitals Group     BP 03/24/19 1543 (!) 131/94     Pulse Rate 03/24/19 1543 79     Resp 03/24/19 1543 16     Temp 03/24/19 1543 98.8 F (37.1 C)     Temp Source 03/24/19 1543 Oral     SpO2 03/24/19 1543 99 %     Weight --      Height --      Head Circumference --      Peak Flow --      Pain Score 03/24/19 1545 10     Pain Loc --      Pain Edu? --      Excl. in [place]? --     Constitutional: Alert and oriented. Well appearing and in no acute distress. Eyes: Conjunctivae are normal.  No nystagmus noted Head: Atraumatic. Nose: No congestion/rhinnorhea. Mouth/Throat: Mucous membranes are moist.   Neck:  supple no lymphadenopathy noted Cardiovascular: Normal rate, regular rhythm. Heart sounds are normal Respiratory: Normal respiratory effort.  No retractions, lungs c t a  GU: deferred Musculoskeletal: FROM all extremities, warm and well perfused Neurologic:  Normal speech and language.  Skin:  Skin is warm, dry and intact. No rash noted. Psychiatric: Mood and affect are normal. Speech and behavior are normal.  ____________________________________________   LABS (all labs ordered are listed, but only abnormal results are displayed)  Labs Reviewed  BASIC METABOLIC PANEL - Abnormal; Notable for the following components:      Result Value   CO2 20 (*)    Glucose, Bld 100 (*)    All other components within normal limits  CBC - Abnormal; Notable for the following components:   Hemoglobin 8.8 (*)    HCT 31.1 (*)    MCV 61.5 (*)    MCH 17.4 (*)    MCHC 28.3 (*)    RDW 19.9 (*)    All other components within normal limits  POCT PREGNANCY, URINE  CBG MONITORING, ED  POC URINE PREG, ED    ____________________________________________   ____________________________________________  RADIOLOGY    ____________________________________________   PROCEDURES  Procedure(s) performed: Normal saline 1 L IV no  Procedures    ____________________________________________   INITIAL IMPRESSION / ASSESSMENT AND PLAN / ED COURSE  Pertinent labs & imaging results that were available during my care of the patient were reviewed by me and considered in my medical decision making (see chart for details).   Patient is 38 year old female presents emergency department complaining  of dizziness and dental pain.  Physical exam patient appears well.  There is no nystagmus noted.  Positive for poor dentition and fractured teeth.  CBC has a decreased H&H of 8.8/31.1.  Patient's last H&H was 9/31. Basic metabolic panel is normal, urine pregnancy and urinalysis are pending    ----------------------------------------- 7:23 PM on 03/24/2019 -----------------------------------------  POC pregnancy is negative.  Patient was given saline 1 L IV.  She states she feels much better after the fluids.  She does not feel dizzy better to still hurts.  She was given a prescription for amoxicillin oxycodone 5 mg.  She is to follow-up 1 of the dental clinics that was provided for her.  Follow-up with regular doctor concerning her anemia.  Had a frank discussion with her about the need to stay on her iron pills.  She can try measures such as ginger/ginger ale and saltines to help with the nausea.  If she becomes constipated with iron pills she can use MiraLAX.  Patient states she understands will comply.  She is discharged in stable condition.  As part of my medical decision making, I reviewed the following data within the [place] notes reviewed and incorporated, Labs reviewed see above, Old chart reviewed, Notes from prior ED visits and [place] Controlled Substance Database   ____________________________________________   FINAL CLINICAL IMPRESSION(S) / ED DIAGNOSES  Final diagnoses:  Dizziness  Iron deficiency anemia, unspecified iron deficiency anemia type  Dental abscess      NEW MEDICATIONS STARTED DURING THIS VISIT:  New Prescriptions   AMOXICILLIN (AMOXIL) 500 MG CAPSULE    Take 1 capsule (500 mg total) by mouth 3 (three) times daily.   OXYCODONE (OXY IR/ROXICODONE) 5 MG IMMEDIATE RELEASE TABLET    Take 1 tablet (5 mg total) by mouth every 4 (four) hours as needed for severe pain.     Note:  This document was prepared using Dragon voice recognition software and may include unintentional dictation errors.    Versie Starks, PA-C 03/24/19 Dorene Grebe, MD 03/24/19 2256

## 2020-03-09 ENCOUNTER — Observation Stay: Payer: Medicare Other

## 2020-03-09 ENCOUNTER — Emergency Department: Payer: Medicare Other

## 2020-03-09 ENCOUNTER — Encounter: Payer: Self-pay | Admitting: Emergency Medicine

## 2020-03-09 ENCOUNTER — Observation Stay
Admission: EM | Admit: 2020-03-09 | Discharge: 2020-03-11 | Disposition: A | Discharge status: Home / Self Care | Payer: Medicare Other | Attending: Hospitalist | Admitting: Hospitalist

## 2020-03-09 DIAGNOSIS — E876 Hypokalemia: Secondary | ICD-10-CM

## 2020-03-09 DIAGNOSIS — R7989 Other specified abnormal findings of blood chemistry: Secondary | ICD-10-CM | POA: Diagnosis not present

## 2020-03-09 DIAGNOSIS — Z87891 Personal history of nicotine dependence: Secondary | ICD-10-CM | POA: Diagnosis not present

## 2020-03-09 DIAGNOSIS — Q8500 Neurofibromatosis, unspecified: Secondary | ICD-10-CM

## 2020-03-09 DIAGNOSIS — Z79899 Other long term (current) drug therapy: Secondary | ICD-10-CM | POA: Insufficient documentation

## 2020-03-09 DIAGNOSIS — R200 Anesthesia of skin: Secondary | ICD-10-CM | POA: Insufficient documentation

## 2020-03-09 DIAGNOSIS — R569 Unspecified convulsions: Principal | ICD-10-CM

## 2020-03-09 DIAGNOSIS — Q8501 Neurofibromatosis, type 1: Secondary | ICD-10-CM | POA: Diagnosis not present

## 2020-03-09 DIAGNOSIS — Z20822 Contact with and (suspected) exposure to covid-19: Secondary | ICD-10-CM | POA: Diagnosis not present

## 2020-03-09 DIAGNOSIS — D473 Essential (hemorrhagic) thrombocythemia: Secondary | ICD-10-CM | POA: Diagnosis not present

## 2020-03-09 DIAGNOSIS — G43909 Migraine, unspecified, not intractable, without status migrainosus: Secondary | ICD-10-CM | POA: Insufficient documentation

## 2020-03-09 DIAGNOSIS — I6521 Occlusion and stenosis of right carotid artery: Secondary | ICD-10-CM

## 2020-03-09 DIAGNOSIS — D649 Anemia, unspecified: Secondary | ICD-10-CM

## 2020-03-09 DIAGNOSIS — Q249 Congenital malformation of heart, unspecified: Secondary | ICD-10-CM | POA: Diagnosis not present

## 2020-03-09 DIAGNOSIS — G40909 Epilepsy, unspecified, not intractable, without status epilepticus: Secondary | ICD-10-CM | POA: Insufficient documentation

## 2020-03-09 DIAGNOSIS — D75839 Thrombocytosis, unspecified: Secondary | ICD-10-CM

## 2020-03-09 LAB — URINALYSIS, COMPLETE (UACMP) WITH MICROSCOPIC
Bacteria, UA: NONE SEEN
Bilirubin Urine: NEGATIVE
Glucose, UA: NEGATIVE mg/dL
Hgb urine dipstick: NEGATIVE
Ketones, ur: 5 mg/dL — AB
Leukocytes,Ua: NEGATIVE
Nitrite: NEGATIVE
Protein, ur: NEGATIVE mg/dL
Specific Gravity, Urine: 1.006 (ref 1.005–1.030)
pH: 6 (ref 5.0–8.0)

## 2020-03-09 LAB — HCG, QUANTITATIVE, PREGNANCY: hCG, Beta Chain, Quant, S: <1 m[IU]/mL (ref <5)

## 2020-03-09 LAB — CBC WITH DIFFERENTIAL/PLATELET
Abs Immature Granulocytes: 0.03 10*3/uL (ref 0.00–0.07)
Basophils Absolute: 0.1 10*3/uL (ref 0.0–0.1)
Basophils Relative: 1 %
Eosinophils Absolute: 0.2 10*3/uL (ref 0.0–0.5)
Eosinophils Relative: 2 %
HCT: 31.8 % — ABNORMAL LOW (ref 36.0–46.0)
Hemoglobin: 8.8 g/dL — ABNORMAL LOW (ref 12.0–15.0)
Immature Granulocytes: 0 %
Lymphocytes Relative: 30 %
Lymphs Abs: 2.2 10*3/uL (ref 0.7–4.0)
MCH: 16.5 pg — ABNORMAL LOW (ref 26.0–34.0)
MCHC: 27.7 g/dL — ABNORMAL LOW (ref 30.0–36.0)
MCV: 59.8 fL — ABNORMAL LOW (ref 80.0–100.0)
Monocytes Absolute: 0.5 10*3/uL (ref 0.1–1.0)
Monocytes Relative: 7 %
Neutro Abs: 4.3 10*3/uL (ref 1.7–7.7)
Neutrophils Relative %: 60 %
Platelets: 460 10*3/uL — ABNORMAL HIGH (ref 150–400)
RBC: 5.32 MIL/uL — ABNORMAL HIGH (ref 3.87–5.11)
RDW: 21.2 % — ABNORMAL HIGH (ref 11.5–15.5)
WBC: 7.3 10*3/uL (ref 4.0–10.5)
nRBC: 0 % (ref 0.0–0.2)

## 2020-03-09 LAB — SARS CORONAVIRUS 2 BY RT PCR (HOSPITAL ORDER, PERFORMED IN ~~LOC~~ HOSPITAL LAB): SARS Coronavirus 2: NEGATIVE

## 2020-03-09 LAB — MAGNESIUM: Magnesium: 1.9 mg/dL (ref 1.7–2.4)

## 2020-03-09 LAB — URINE DRUG SCREEN, QUALITATIVE (ARMC ONLY)
Amphetamines, Ur Screen: NOT DETECTED
Barbiturates, Ur Screen: NOT DETECTED
Benzodiazepine, Ur Scrn: POSITIVE — AB
Cannabinoid 50 Ng, Ur ~~LOC~~: POSITIVE — AB
Cocaine Metabolite,Ur ~~LOC~~: NOT DETECTED
MDMA (Ecstasy)Ur Screen: NOT DETECTED
Methadone Scn, Ur: NOT DETECTED
Opiate, Ur Screen: NOT DETECTED
Phencyclidine (PCP) Ur S: NOT DETECTED
Tricyclic, Ur Screen: NOT DETECTED

## 2020-03-09 LAB — COMPREHENSIVE METABOLIC PANEL
ALT: 23 U/L (ref 0–44)
AST: 24 U/L (ref 15–41)
Albumin: 4.1 g/dL (ref 3.5–5.0)
Alkaline Phosphatase: 63 U/L (ref 38–126)
Anion gap: 9 (ref 5–15)
BUN: 8 mg/dL (ref 6–20)
CO2: 23 mmol/L (ref 22–32)
Calcium: 8.9 mg/dL (ref 8.9–10.3)
Chloride: 108 mmol/L (ref 98–111)
Creatinine, Ser: 0.65 mg/dL (ref 0.44–1.00)
GFR calc Af Amer: >60 mL/min (ref >60)
GFR calc non Af Amer: >60 mL/min (ref >60)
Glucose, Bld: 107 mg/dL — ABNORMAL HIGH (ref 70–99)
Potassium: 2.9 mmol/L — ABNORMAL LOW (ref 3.5–5.1)
Sodium: 140 mmol/L (ref 135–145)
Total Bilirubin: 0.4 mg/dL (ref 0.3–1.2)
Total Protein: 7.3 g/dL (ref 6.5–8.1)

## 2020-03-09 LAB — POCT PREGNANCY, URINE: Preg Test, Ur: NEGATIVE

## 2020-03-09 LAB — CK: Total CK: 185 U/L (ref 38–234)

## 2020-03-09 LAB — PHOSPHORUS: Phosphorus: 2.5 mg/dL (ref 2.5–4.6)

## 2020-03-09 MED ORDER — IBUPROFEN 400 MG PO TABS
400.0000 mg | ORAL_TABLET | ORAL | Status: DC | PRN
  Administered 2020-03-10: 400 mg via ORAL
  Filled 2020-03-09: qty 1

## 2020-03-09 MED ORDER — GADOBUTROL 1 MMOL/ML IV SOLN
9.0000 mL | Freq: Once | INTRAVENOUS | Status: AC | PRN
  Administered 2020-03-09: 9 mL via INTRAVENOUS

## 2020-03-09 MED ORDER — LORAZEPAM 2 MG/ML IJ SOLN
INTRAMUSCULAR | Status: AC
  Filled 2020-03-09: qty 1

## 2020-03-09 MED ORDER — LORAZEPAM 2 MG/ML IJ SOLN
2.0000 mg | Freq: Once | INTRAMUSCULAR | Status: AC
  Administered 2020-03-09: 2 mg via INTRAVENOUS

## 2020-03-09 MED ORDER — OXYCODONE HCL 5 MG PO TABS
5.0000 mg | ORAL_TABLET | Freq: Four times a day (QID) | ORAL | Status: DC | PRN
  Administered 2020-03-09: 5 mg via ORAL
  Filled 2020-03-09: qty 1

## 2020-03-09 MED ORDER — TRAMADOL HCL 50 MG PO TABS: 50.0000 mg | ORAL_TABLET | Freq: Four times a day (QID) | ORAL | Status: DC | PRN

## 2020-03-09 MED ORDER — SODIUM CHLORIDE 0.9 % IV SOLN
2000.0000 mg | Freq: Once | INTRAVENOUS | Status: AC
  Administered 2020-03-09 (×2): 2000 mg via INTRAVENOUS
  Filled 2020-03-09: qty 20

## 2020-03-09 MED ORDER — SODIUM CHLORIDE 0.9 % IV SOLN
75.0000 mL/h | INTRAVENOUS | Status: DC
  Administered 2020-03-10: 75 mL/h via INTRAVENOUS

## 2020-03-09 MED ORDER — GABAPENTIN 300 MG PO CAPS
900.0000 mg | ORAL_CAPSULE | Freq: Three times a day (TID) | ORAL | Status: DC
  Administered 2020-03-09 – 2020-03-11 (×6): 900 mg via ORAL
  Filled 2020-03-09 (×6): qty 3

## 2020-03-09 MED ORDER — TOPIRAMATE 25 MG PO TABS
25.0000 mg | ORAL_TABLET | Freq: Every day | ORAL | Status: DC
  Administered 2020-03-09 – 2020-03-10 (×2): 25 mg via ORAL
  Filled 2020-03-09 (×3): qty 1

## 2020-03-09 MED ORDER — FERROUS SULFATE 325 (65 FE) MG PO TABS
325.0000 mg | ORAL_TABLET | Freq: Every day | ORAL | Status: DC
  Administered 2020-03-10 – 2020-03-11 (×2): 325 mg via ORAL
  Filled 2020-03-09 (×3): qty 1

## 2020-03-09 MED ORDER — POTASSIUM CHLORIDE 10 MEQ/100ML IV SOLN
10.0000 meq | INTRAVENOUS | Status: AC
  Administered 2020-03-09 (×2): 10 meq via INTRAVENOUS
  Filled 2020-03-09 (×2): qty 100

## 2020-03-09 MED ORDER — LEVETIRACETAM 750 MG PO TABS
1500.0000 mg | ORAL_TABLET | Freq: Two times a day (BID) | ORAL | Status: DC
  Administered 2020-03-09 – 2020-03-11 (×4): 1500 mg via ORAL
  Filled 2020-03-09: qty 2
  Filled 2020-03-09: qty 3
  Filled 2020-03-09 (×2): qty 2
  Filled 2020-03-09: qty 3

## 2020-03-09 MED ORDER — ENOXAPARIN SODIUM 40 MG/0.4ML ~~LOC~~ SOLN
40.0000 mg | SUBCUTANEOUS | Status: DC
  Administered 2020-03-10 (×2): 40 mg via SUBCUTANEOUS
  Filled 2020-03-09 (×2): qty 0.4

## 2020-03-09 NOTE — ED Notes (Signed)
Pt talking to family on phone. Pt reports continued pain and verbalized to this RN she would like IV dilaudid. RN reports that will not be possible and MD has already verbalized that will not be used for pain control. Pt agreeable but frustrated with answer at this time.

## 2020-03-09 NOTE — ED Notes (Signed)
Admitting at bedside to assess patient

## 2020-03-09 NOTE — Consult Note (Signed)
Reason for Consult:Seizure like activity  Requesting Physician: Charna Archer  CC: Seizures  I have been asked by Dr. Charna Archer to see this patient in consultation for seizure like activity.  HPI: Valerie Mckinney is an 39 y.o. female with a history of NF1 and seizures who presents with seizure activity.  The patient reports that she has not had a seizure since 2016.  On Keppra and reports compliance.  Per EMS patient was noted to have seizure activity on their arrival.  GTC activity was described and patient received 6mg  Versed en route.  In ED noted to have further seizure like activity described as generalized shaking while maintaining the ability to communicate.  Patient also describes RUE numbness that has been present for the past 1-2 months.  At baseline patient reports she has difficulty with ambulation and uses a walker.    Past Medical History:  Diagnosis Date  . Allergy   . Anemia   . Broken toe 04/2016   broke outer toes on right foot, went to ED and is wearing a boot  . GERD (gastroesophageal reflux disease)   . 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 Seizures ([place])     Past Surgical History:  Procedure Laterality Date  . BACK SURGERY     tumor removed  . BREAST SURGERY Right    benign tumor removed  . CHOLECYSTECTOMY    . EXPLORATORY LAPAROTOMY    . FRACTURE SURGERY Right    ankle  . HAND SURGERY Right    tumor removed  . OTHER SURGICAL HISTORY Right    arm (3 tumors removed)  . TUBAL LIGATION      Family History  Problem Relation Age of Onset  . Heart disease Mother   . Cancer Father   . Neurofibromatosis Brother   . Sickle cell trait Brother     Social History:  reports that she has been smoking cigarettes. She has been smoking about 0.50 packs per day. She has never used smokeless tobacco. She reports that she does not drink alcohol or use drugs.  Allergies  Allergen Reactions  . Flagyl [Metronidazole] Anaphylaxis and Hives    Throat swells up  .  Zofran [Ondansetron Hcl] Anaphylaxis    Throat swells up  . Amitriptyline Other (See Comments)    Kidney failure  . Benadryl [Diphenhydramine] Other (See Comments)    seizures  . Tylenol [Acetaminophen] Other (See Comments)    seizures    Medications:  Prior to Admission medications   Medication Sig Start Date End Date Taking? Authorizing Provider  amoxicillin (AMOXIL) 500 MG capsule Take 1 capsule (500 mg total) by mouth 3 (three) times daily. 03/24/19   Fisher, Linden Dolin, PA-C  ferrous sulfate (EQL SLOW RELEASE IRON) 160 (50 Fe) MG TBCR SR tablet Take 1 tablet (160 mg total) by mouth every other day. 01/13/19   Earleen Newport, MD  gabapentin (NEURONTIN) 100 MG capsule Take 300 mg by mouth 3 (three) times daily.    [provider]  ibuprofen (ADVIL,MOTRIN) 600 MG tablet Take 1 tablet (600 mg total) by mouth every 6 (six) hours as needed. 03/30/17   Sable Feil, PA-C  levETIRAcetam (KEPPRA) 500 MG tablet Take 1,500 mg by mouth 2 (two) times daily.     [provider]  medroxyPROGESTERone (PROVERA) 10 MG tablet Take 1 tablet (10 mg total) by mouth daily. 01/13/19   Earleen Newport, MD  naproxen (NAPROSYN) 500 MG tablet Take 1 tablet (500 mg total)  by mouth 2 (two) times daily with a meal. 11/10/17   Sable Feil, PA-C  nortriptyline (PAMELOR) 10 MG capsule Take 20 mg by mouth at bedtime.  08/18/15   [provider]  oxyCODONE (OXY IR/ROXICODONE) 5 MG immediate release tablet Take 1 tablet (5 mg total) by mouth every 4 (four) hours as needed for severe pain. 03/24/19   Fisher, Linden Dolin, PA-C  topiramate (TOPAMAX) 15 MG capsule Take 15 mg by mouth at bedtime.    [provider]     ROS: History obtained from the patient  General ROS: negative for - chills, fatigue, fever, night sweats, weight gain or weight loss Psychological ROS: negative for - behavioral disorder, hallucinations, memory difficulties, mood swings or suicidal ideation Ophthalmic  ROS: negative for - blurry vision, double vision, eye pain or loss of vision ENT ROS: negative for - epistaxis, nasal discharge, oral lesions, sore throat, tinnitus or vertigo Allergy and Immunology ROS: negative for - hives or itchy/watery eyes Hematological and Lymphatic ROS: negative for - bleeding problems, bruising or swollen lymph nodes Endocrine ROS: negative for - galactorrhea, hair pattern changes, polydipsia/polyuria or temperature intolerance Respiratory ROS: negative for - cough, hemoptysis, shortness of breath or wheezing Cardiovascular ROS: negative for - chest pain, dyspnea on exertion, edema or irregular heartbeat Gastrointestinal ROS: negative for - abdominal pain, diarrhea, hematemesis, nausea/vomiting or stool incontinence Genito-Urinary ROS: negative for - dysuria, hematuria, incontinence or urinary frequency/urgency Musculoskeletal ROS: negative for - joint swelling or muscular weakness Neurological ROS: as noted in HPI Dermatological ROS: negative for rash and skin lesion changes  Physical Examination: Blood pressure 129/76, pulse 81, temperature 98.8 F (37.1 C), temperature source Axillary, resp. rate (!) 27, height 5\' 5"  (1.651 m), weight 90.7 kg, SpO2 100 %.  HEENT-  Normocephalic, no lesions, without obvious abnormality.  Normal external eye and conjunctiva.  Normal TM's bilaterally.  Normal auditory canals and external ears. Normal external nose, mucus membranes and septum.  Normal pharynx. Cardiovascular- S1, S2 normal, pulses palpable throughout   Lungs- chest clear, no wheezing, rales, normal symmetric air entry, Heart exam - S1, S2 normal, no murmur, no gallop, rate regular Abdomen- soft, non-tender; bowel sounds normal; no masses,  no organomegaly Extremities- no edema Lymph-no adenopathy palpable Musculoskeletal-no joint tenderness, deformity or swelling Skin-warm and dry, no hyperpigmentation, vitiligo, or suspicious lesions  Neurological Examination    Mental Status: Alert but agitated and preoccupied by her potassium infusion.  Speech fluent without evidence of aphasia.  Able to follow 3 step commands without difficulty. Cranial Nerves: II: Visual fields grossly normal, pupils equal, round, reactive to light and accommodation III,IV, VI: ptosis not present, extra-ocular motions intact bilaterally V,VII: mild right facial droop, facial light touch sensation normal bilaterally VIII: hearing normal bilaterally IX,X: gag reflex present XI: bilateral shoulder shrug XII: midline tongue extension Motor: Lifts both upper extremities off the bed. Able to flex the lower extremities but does not lift high off the bed Sensory: Pinprick and light touch decreased in the RUE Deep Tendon Reflexes: Symmetric throughout Plantars: Right: mute   Left: mute Cerebellar: Normal finger-to-nose testing bilaterally Gait: not tested due to safety concerns    Laboratory Studies:   Basic Metabolic Panel: Recent Labs  Lab 03/09/20 1452  NA 140  K 2.9*  CL 108  CO2 23  GLUCOSE 107*  BUN 8  CREATININE 0.65  CALCIUM 8.9  MG 1.9    Liver Function Tests: Recent Labs  Lab 03/09/20 1452  AST 24  ALT 23  ALKPHOS 63  BILITOT 0.4  PROT 7.3  ALBUMIN 4.1   No results for input(s): LIPASE, AMYLASE in the last 168 hours. No results for input(s): AMMONIA in the last 168 hours.  CBC: Recent Labs  Lab 03/09/20 1452  WBC 7.3  NEUTROABS 4.3  HGB 8.8*  HCT 31.8*  MCV 59.8*  PLT 460*    Cardiac Enzymes: Recent Labs  Lab 03/09/20 1452  CKTOTAL 185    BNP: Invalid input(s): POCBNP  CBG: No results for input(s): GLUCAP in the last 168 hours.  Microbiology: Results for orders placed or performed during the hospital encounter of 03/09/20  SARS Coronavirus 2 by RT PCR (hospital order, performed in [facility] hospital lab) Nasopharyngeal Nasopharyngeal Swab     Status: None   Collection Time: 03/09/20  4:10 PM   Specimen: Nasopharyngeal  Swab  Result Value Ref Range Status   SARS Coronavirus 2 NEGATIVE NEGATIVE Final    Comment: (NOTE) SARS-CoV-2 target nucleic acids are NOT DETECTED. The SARS-CoV-2 RNA is generally detectable in upper and lower respiratory specimens during the acute phase of infection. The lowest concentration of SARS-CoV-2 viral copies this assay can detect is 250 copies / mL. A negative result does not preclude SARS-CoV-2 infection and should not be used as the sole basis for treatment or other patient management decisions.  A negative result may occur with improper specimen collection / handling, submission of specimen other than nasopharyngeal swab, presence of viral mutation(s) within the areas targeted by this assay, and inadequate number of viral copies (<250 copies / mL). A negative result must be combined with clinical observations, patient history, and epidemiological information. Fact Sheet for Patients:   StrictlyIdeas.no Fact Sheet for Healthcare Providers: BankingDealers.co.za This test is not yet approved or cleared  by the [place] FDA and has been authorized for detection and/or diagnosis of SARS-CoV-2 by FDA under an Emergency Use Authorization (EUA).  This EUA will remain in effect (meaning this test can be used) for the duration of the COVID-19 declaration under Section 564(b)(1) of the Act, 21 U.S.C. section 360bbb-3(b)(1), unless the authorization is terminated or revoked sooner. Performed at [facility], [address] [place], [place] [address]     Coagulation Studies: No results for input(s): LABPROT, INR in the last 72 hours.  Urinalysis:  Recent Labs  Lab 03/09/20 [facility] 1.006  PHURINE 6.0  GLUCOSEU NEGATIVE  HGBUR NEGATIVE  BILIRUBINUR NEGATIVE  KETONESUR 5*  PROTEINUR NEGATIVE  NITRITE NEGATIVE  LEUKOCYTESUR NEGATIVE    Lipid Panel:  No results found for: CHOL,  TRIG, HDL, CHOLHDL, VLDL, LDLCALC  HgbA1C: No results found for: HGBA1C  Urine Drug Screen:      Component Value Date/Time   LABOPIA NEGATIVE 01/30/2015 1110   COCAINSCRNUR NEGATIVE 01/30/2015 1110   LABBENZ POSITIVE 01/30/2015 1110   AMPHETMU NEGATIVE 01/30/2015 1110   THCU NEGATIVE 01/30/2015 1110   LABBARB NEGATIVE 01/30/2015 1110    Alcohol Level: No results for input(s): ETH in the last 168 hours.  Other results: EKG: sinus tachycardia at 102 bpm.  Imaging: No results found.   Assessment/Plan: 39 y.o. female with a history of NF1 and seizures who presents with seizure activity.  The patient reports that she has not had a seizure since 2016.  On Keppra and reports compliance.  Per EMS patient was noted to have seizure activity on their arrival.  GTC activity was described and patient received 6mg  Versed en route.  In ED noted to have further seizure like activity but with features concerning for nonepileptic events.  Patient on Keppra 1500mg  BID, Neurontin and Topamax.  Also describes RUE numbness that is new over the past couple of months. From review of records it seems she was having some neck pain last year but it is unclear if she ever had her imaging or NS evaluation.   Will rule out a new neurofibroma.  Recommendations: 1. Seizure precautions 2. Increase Topamax to 25mg  q hs 3. EEG 4. MRI of the brain with and without contrast 5. MRI of the cervical spine with and without contrast 6. If seizure like activity continues with the same characteristics would not continue to treat with benzodiazepines 7. UDS, serum magnesium and serum phosphorus 8. Continue Keppra and Neurontin at home doses  Alexis Goodell, MD Neurology [phone] 03/09/2020, 5:59 PM

## 2020-03-09 NOTE — ED Notes (Signed)
Pt remains in MRI upon reassessment

## 2020-03-09 NOTE — H&P (Signed)
History and Physical    Valerie Mckinney [id number] DOB: 1981-09-04 DOA: 03/09/2020  PCP: Herminio Commons, MD  Patient coming from: Home  I have personally briefly reviewed patient's old medical records in [place]  Chief Complaint: Seizure-like activity  HPI: Valerie Mckinney is a 39 y.o. female with medical history significant for seizure on Keppra, chronic migraine, neurofibromatosis type I, congenital heart disease who presents with concerns of seizure-like activity.  EMS called for witnessed seizure by her goddaughter.  Patient does not recall event.  States her last seizure was about 4 years ago and reports compliant with Keppra.  Denies bowel or bladder incontinence with episode.  States during her previous seizure she is sometimes aware of her surroundings during episodes.  She often feels a "weird feeling" all over body prior to the episodes.  Reports marijuana use this week which she uses for "pain" all of her body from her neurofibromatosis.  Denies alcohol use.  Denies any fever.  No new life stressors.  She received IV 6mg  Versed en route to ED after witnessed tonic-clonic seizure by EMS.  In the ED, she was noted to have rhythmic contraction of left side and was able to answer occasional questions and was given Ativan and Keppra in ED. 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 CT head negative.   No leukocytosis. Hgb of 8.8 which is chronic and around baseline. Plt of 460. K of 2.9 but CMP otherwise unremarkable. CK 185. Negative pregnancy test. Negative COVID PCR.   Reports that she follows Dr. Antionette Fairy with neurology [facility].  Also sees Dr. Starleen Blue with [facility].   Review of Systems:  Constitutional: No Weight Change, No Fever ENT/Mouth: No sore throat, No Rhinorrhea Eyes: No Eye Pain, No Vision Changes Cardiovascular: No Chest  Pain, no SOB Respiratory: No Cough, No Sputum,   Gastrointestinal: No Nausea, No Vomiting, No Diarrhea, No Constipation, No Pain Genitourinary: no Urinary Incontinence Musculoskeletal: No Arthralgias, No Myalgias Skin: No Skin Lesions, No Pruritus, Neuro: no Weakness, No Numbness,  No Loss of Consciousness, No Syncope Psych: No Anxiety/Panic, No Depression, no decrease appetite Heme/Lymph: No Bruising, No Bleeding  Past Medical History:  Diagnosis Date  . Allergy   . Anemia   . Broken toe 04/2016   broke outer toes on right foot, went to ED and is wearing a boot  . GERD (gastroesophageal reflux disease)   . 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)   . Seizures ([place])     Past Surgical History:  Procedure Laterality Date  . BACK SURGERY     tumor removed  . BREAST SURGERY Right    benign tumor removed  . CHOLECYSTECTOMY    . EXPLORATORY LAPAROTOMY    . FRACTURE SURGERY Right    ankle  . HAND SURGERY Right    tumor removed  . OTHER SURGICAL HISTORY Right    arm (3 tumors removed)  . TUBAL LIGATION       reports that she has been smoking cigarettes. She has been smoking about 0.50 packs per day. She has never used smokeless tobacco. She reports that she does not drink alcohol or use drugs.  Allergies  Allergen Reactions  . Acetaminophen Other (See Comments)    seizures "Trigger for my seizures" Other reaction(s): Seizures   . Cherry Flavor Swelling  . Diphenhydramine Hcl Other (See Comments)  CAUSES SEIZURES seizure Other reaction(s): Seizures   . Flagyl [Metronidazole] Anaphylaxis and Hives    Throat swells up  . Tape Hives  . Zofran [Ondansetron Hcl] Anaphylaxis    Throat swells up  . Amitriptyline Other (See Comments)    Kidney failure    Family History  Problem Relation Age of Onset  . Heart disease Mother   . Cancer Father   . Neurofibromatosis Brother   . Sickle cell trait Brother      Prior to Admission medications   Medication Sig Start  Date End Date Taking? Authorizing Provider  ferrous sulfate (EQL SLOW RELEASE IRON) 160 (50 Fe) MG TBCR SR tablet Take 1 tablet (160 mg total) by mouth every other day. 01/13/19  Yes Earleen Newport, MD  gabapentin (NEURONTIN) 300 MG capsule Take 900 mg by mouth 3 (three) times daily. 12/16/19  Yes [provider]  levETIRAcetam (KEPPRA) 500 MG tablet Take 1,500 mg by mouth 2 (two) times daily.    Yes [provider]  albuterol (VENTOLIN HFA) 108 (90 Base) MCG/ACT inhaler Inhale 1-2 puffs into the lungs every 4 (four) hours as needed for shortness of breath or wheezing. 02/02/20   [provider]  ibuprofen (ADVIL) 200 MG tablet Take 400 mg by mouth every 6 (six) hours as needed.    [provider]  medroxyPROGESTERone (PROVERA) 10 MG tablet Take 1 tablet (10 mg total) by mouth daily. Patient not taking: Reported on 03/09/2020 01/13/19   Earleen Newport, MD  nortriptyline (PAMELOR) 10 MG capsule Take 20 mg by mouth at bedtime.  08/18/15   [provider]    Physical Exam: Vitals:   03/09/20 1715 03/09/20 1730 03/09/20 1800 03/09/20 1830  BP:  118/88 114/74 111/76  Pulse: 81 85 84 74  Resp: (!) 27 (!) 27 (!) 27 (!) 25  Temp:      TempSrc:      SpO2: 100% 100% 100% 100%  Weight:      Height:        Constitutional: NAD, calm, lethargic appearing young female laying flat in bed Vitals:   03/09/20 1715 03/09/20 1730 03/09/20 1800 03/09/20 1830  BP:  118/88 114/74 111/76  Pulse: 81 85 84 74  Resp: (!) 27 (!) 27 (!) 27 (!) 25  Temp:      TempSrc:      SpO2: 100% 100% 100% 100%  Weight:      Height:       Eyes: PERRL, lids and conjunctivae normal ENMT: Mucous membranes are moist.  Neck: normal, supple Respiratory: clear to auscultation bilaterally, no wheezing, no crackles. Normal respiratory effort on room air. No accessory muscle use.  Cardiovascular: Regular rate and rhythm, no murmurs / rubs / gallops. No extremity edema. 2+ pedal  pulses.  Abdomen: no tenderness, no masses palpated.  Bowel sounds positive.  Musculoskeletal: no clubbing / cyanosis. No joint deformity upper and lower extremities. Good ROM, no contractures. Normal muscle tone.  Skin: Cutaneous nodules noted to left upper extremity and left pretibial region of the lower extremity. Neurologic: CN 2-12 grossly intact. Sensation intact.  Strength 4-5 of the upper extremities, weak bilateral handgrip.  5 out of 5 strength of the lower extremities.  No nystagmus.  Intact finger-nose. Psychiatric: Normal judgment and insight. Alert and oriented x 3. Normal mood.     Labs on Admission: I have personally reviewed following labs and imaging studies  CBC: Recent Labs  Lab 03/09/20 1452  WBC 7.3  NEUTROABS 4.3  HGB 8.8*  HCT 31.8*  MCV 59.8*  PLT 123456*   Basic Metabolic Panel: Recent Labs  Lab 03/09/20 1452  NA 140  K 2.9*  CL 108  CO2 23  GLUCOSE 107*  BUN 8  CREATININE 0.65  CALCIUM 8.9  MG 1.9   GFR: Estimated Creatinine Clearance: 108.2 mL/min (by C-G formula based on SCr of 0.65 mg/dL). Liver Function Tests: Recent Labs  Lab 03/09/20 1452  AST 24  ALT 23  ALKPHOS 63  BILITOT 0.4  PROT 7.3  ALBUMIN 4.1   No results for input(s): LIPASE, AMYLASE in the last 168 hours. No results for input(s): AMMONIA in the last 168 hours. Coagulation Profile: No results for input(s): INR, PROTIME in the last 168 hours. Cardiac Enzymes: Recent Labs  Lab 03/09/20 1452  CKTOTAL 185   BNP (last 3 results) No results for input(s): PROBNP in the last 8760 hours. HbA1C: No results for input(s): HGBA1C in the last 72 hours. CBG: No results for input(s): GLUCAP in the last 168 hours. Lipid Profile: No results for input(s): CHOL, HDL, LDLCALC, TRIG, CHOLHDL, LDLDIRECT in the last 72 hours. Thyroid Function Tests: No results for input(s): TSH, T4TOTAL, FREET4, T3FREE, THYROIDAB in the last 72 hours. Anemia Panel: No results for input(s):  VITAMINB12, FOLATE, FERRITIN, TIBC, IRON, RETICCTPCT in the last 72 hours. Urine analysis:    Component Value Date/Time   COLORURINE STRAW (A) 03/09/2020 1453   APPEARANCEUR CLEAR (A) 03/09/2020 1453   APPEARANCEUR Cloudy 02/01/2015 1015   LABSPEC 1.006 03/09/2020 1453   LABSPEC 1.005 02/01/2015 1015   PHURINE 6.0 03/09/2020 1453   GLUCOSEU NEGATIVE 03/09/2020 1453   GLUCOSEU Negative 02/01/2015 1015   HGBUR NEGATIVE 03/09/2020 1453   BILIRUBINUR NEGATIVE 03/09/2020 1453   BILIRUBINUR Negative 02/01/2015 1015   KETONESUR 5 (A) 03/09/2020 1453   PROTEINUR NEGATIVE 03/09/2020 1453   NITRITE NEGATIVE 03/09/2020 1453   LEUKOCYTESUR NEGATIVE 03/09/2020 1453   LEUKOCYTESUR Trace 02/01/2015 1015    Radiological Exams on Admission: CT Head Wo Contrast  Result Date: 03/09/2020 CLINICAL DATA:  Seizure. Chronic headaches. EXAM: CT HEAD WITHOUT CONTRAST TECHNIQUE: Contiguous axial images were obtained from the base of the skull through the vertex without intravenous contrast. COMPARISON:  07/25/2008 FINDINGS: Brain: No evidence of acute infarction, hemorrhage, hydrocephalus, extra-axial collection, or mass lesion/mass effect. Vascular:  No hyperdense vessel or other acute findings. Skull: No evidence of fracture or other significant bone abnormality. Sinuses/Orbits:  No acute findings. Other: None. IMPRESSION: Negative noncontrast head CT. Electronically Signed   By: Marlaine Hind M.D.   On: 03/09/2020 18:11      Assessment/Plan  Seizure-like activity Patient was evaluated by neurology Placed on seizure precaution Obtain MRI brain with and without contrast, MRI of the cervical spine with and without contrast EEG  Obtain UDS, serum magnesium and serum phosphorus Neurology recommends that if seizure-like activity continues with the same characteristic to not treat with benzodiazepine Increase Topamax to 25 mg nightly Continue Keppra and gabapentin at home doses  Chronic anemia Stable   Thrombocytosis  Unclear etiology but could be reactive  Hypokalemia Replete. Mg of 1.9.  Neurofibromatosis Patient complains of pain all over. Ibuprofen PRN for mild pain, low dose oxycodone for moderate and severe pain-okay with pharmach regarding seizure threshold will avoid IV opioids if possible since patient appears to have some drug-seeking behavior and endorses marijuana use.  States she was "told by her cousin that if she has seizures to ask for Dilaudid  and oxycodone."  DVT prophylaxis:.Lovenox Code Status: Full Family Communication: Plan discussed with patient at bedside  disposition Plan: Home with observation Consults called:  Admission status: Observation  Status is: Observation  The patient remains OBS appropriate and will d/c before 2 midnights.  Dispo: The patient is from: Home              Anticipated d/c is to: Home              Anticipated d/c date is: 1 day              Patient currently is not medically stable to d/c.         Orene Desanctis DO [facility]   If 7PM-7AM, please contact night-coverage [email or web address]   03/09/2020, 9:07 PM

## 2020-03-09 NOTE — ED Notes (Addendum)
This RN at bedside to administer pt's keppra. Pt on facetime with sister. While hooking up medication to IV pt starts jerking for 30secs. No vital sign changes at this time. MD made aware.

## 2020-03-09 NOTE — ED Provider Notes (Signed)
-----------------------------------------   3:00 PM on 03/09/2020 -----------------------------------------  Blood pressure 127/86, pulse 99, temperature 98.8 F (37.1 C), temperature source Axillary, resp. rate (!) 27, height 5\' 5"  (1.651 m), weight 90.7 kg, SpO2 99 %.  Assuming care from Dr. Ellender Hose.  In short, ROSSETTA Mckinney is a 39 y.o. female with a chief complaint of Seizures .  Refer to the original H&P for additional details.  The current plan of care is to follow-up CT and labs following breakthrough seizure.  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  CT head is negative for acute process and remainder of lab work is unremarkable.  Neurology recommends admission for further work-up.  Patient currently sleeping comfortably with no further episodes.    Blake Divine, MD 03/09/20 1816

## 2020-03-09 NOTE — ED Notes (Signed)
Pt talking to MRI for clearance at this time.

## 2020-03-09 NOTE — ED Triage Notes (Signed)
Pt to ED by EMS from home where Pt's husband witnessed a seizure that he states lasted approx 10 mins. Pt has hx of seizures and has not had one in approx 4 yrs. EMS administered 6 mg of Versed total in 3 doses in route. Pt was placed on 2 L of O2 in route due to seizure activity causing pt to de-sat to 90%. Per husband pt had the flu last week.

## 2020-03-09 NOTE — ED Notes (Signed)
Pt remains in MRI upon reassessment.

## 2020-03-09 NOTE — Consult Note (Addendum)
MEDICATION RELATED CONSULT NOTE - INITIAL   Pharmacy Consult: Drug-drug interaction and monitoring of antiepileptic medications  Allergies  Allergen Reactions  . Flagyl [Metronidazole] Anaphylaxis and Hives    Throat swells up  . Zofran [Ondansetron Hcl] Anaphylaxis    Throat swells up  . Amitriptyline Other (See Comments)    Kidney failure  . Benadryl [Diphenhydramine] Other (See Comments)    seizures  . Tylenol [Acetaminophen] Other (See Comments)    seizures    Patient Measurements: Height: 5\' 5"  (165.1 cm) Weight: 90.7 kg (200 lb) IBW/kg (Calculated) : 57  Vital Signs: Temp: 98.8 F (37.1 C) (05/25 1454) Temp Source: Axillary (05/25 1454) BP: 111/76 (05/25 1830) Pulse Rate: 74 (05/25 1830)  Labs: Recent Labs    03/09/20 1452  WBC 7.3  HGB 8.8*  HCT 31.8*  PLT 460*  CREATININE 0.65  MG 1.9  ALBUMIN 4.1  PROT 7.3  AST 24  ALT 23  ALKPHOS 63  BILITOT 0.4   Estimated Creatinine Clearance: 108.2 mL/min (by C-G formula based on SCr of 0.65 mg/dL).  Medical History: Past Medical History:  Diagnosis Date  . Allergy   . Anemia   . Broken toe 04/2016   broke outer toes on right foot, went to ED and is wearing a boot  . GERD (gastroesophageal reflux disease)   . 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 Seizures ([place])     Medications:   Prior to admission:  *Medication reconciliation has not been completed yet this admission*  -Levetiracetam 1500 mg BID -Topiramate 50 mg QHS (migraine prophylaxis) -Gabapentin 900 mg TID (neuropathic pain) -Nortriptyline 20 mg QHS (migraine prophylaxis)   Antiepileptic drugs so far this admission: -Midazolam 6 mg en route to hospital -Levetiracetam 2000 mg IV x 1 -Lorazepam 2 mg IV x  1   Assessment: Patient is a 39 y/o F with a medical history including neurofibromatosis type 1, seizures, migraines, sciatica who presented to the ED with seizure like activity.   Plan:  -No clinically relevant drug-drug  interactions identified at this time -Patient is currently ordered tramadol PRN. Tramadol can lower the seizure threshold. Consideration can be given to using alternate analgesic without these properties -Levetiracetam level has been ordered -Pharmacy will continue to monitor  [place] Resident 03/09/2020,7:25 PM

## 2020-03-09 NOTE — ED Provider Notes (Signed)
[facility] Emergency Department Provider Note  ____________________________________________   First MD Initiated Contact with Patient 03/09/20 1445     (approximate)  I have reviewed the triage vital signs and the nursing notes.   HISTORY  Chief Complaint Seizures    HPI Valerie Mckinney is a 39 y.o. female with history of neurofibromatosis type I, seizure disorder, chronic headaches, here with reported seizures.  History is limited as patient is having seizure-like activity on arrival. Per ems report, pt was witnessed having GTC activity, shaking all extremities and minimally responsive on their arrival. Has been given a total of versed 6 mg IV en route, with slight improvement. She has been intermittently responsive.   Level 5 caveat invoked as remainder of history, ROS, and physical exam limited due to patient's AMS/seizures.        Past Medical History:  Diagnosis Date  . Allergy   . Anemia   . Broken toe 04/2016   broke outer toes on right foot, went to ED and is wearing a boot  . GERD (gastroesophageal reflux disease)   . 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 Seizures [facility])     Patient Active Problem List   Diagnosis Date Noted  . DJD (degenerative joint disease) of knee 11/16/2015  . Neurofibromatosis ([place]) 09/16/2015  . 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 Bilateral occipital neuralgia 09/16/2015  . Migraine 09/16/2015    Past Surgical History:  Procedure Laterality Date  . BACK SURGERY     tumor removed  . BREAST SURGERY Right    benign tumor removed  . CHOLECYSTECTOMY    . EXPLORATORY LAPAROTOMY    . FRACTURE SURGERY Right    ankle  . HAND SURGERY Right    tumor removed  . OTHER SURGICAL HISTORY Right    arm (3 tumors removed)  . TUBAL LIGATION      Prior to Admission medications   Medication Sig Start Date End Date Taking? Authorizing Provider  amoxicillin (AMOXIL) 500 MG capsule Take 1 capsule (500  mg total) by mouth 3 (three) times daily. 03/24/19   Fisher, Linden Dolin, PA-C  ferrous sulfate (EQL SLOW RELEASE IRON) 160 (50 Fe) MG TBCR SR tablet Take 1 tablet (160 mg total) by mouth every other day. 01/13/19   Earleen Newport, MD  gabapentin (NEURONTIN) 100 MG capsule Take 300 mg by mouth 3 (three) times daily.    [provider]  ibuprofen (ADVIL,MOTRIN) 600 MG tablet Take 1 tablet (600 mg total) by mouth every 6 (six) hours as needed. 03/30/17   Sable Feil, PA-C  levETIRAcetam (KEPPRA) 500 MG tablet Take 1,500 mg by mouth 2 (two) times daily.     [provider]  medroxyPROGESTERone (PROVERA) 10 MG tablet Take 1 tablet (10 mg total) by mouth daily. 01/13/19   Earleen Newport, MD  naproxen (NAPROSYN) 500 MG tablet Take 1 tablet (500 mg total) by mouth 2 (two) times daily with a meal. 11/10/17   Sable Feil, PA-C  nortriptyline (PAMELOR) 10 MG capsule Take 20 mg by mouth at bedtime.  08/18/15   [provider]  oxyCODONE (OXY IR/ROXICODONE) 5 MG immediate release tablet Take 1 tablet (5 mg total) by mouth every 4 (four) hours as needed for severe pain. 03/24/19   Fisher, Linden Dolin, PA-C  topiramate (TOPAMAX) 15 MG capsule Take 15 mg by mouth at bedtime.    [provider]    Allergies Flagyl [metronidazole], Zofran [ondansetron hcl],  Amitriptyline, Benadryl [diphenhydramine], and Tylenol [acetaminophen]  Family History  Problem Relation Age of Onset  . Heart disease Mother   . Cancer Father   . Neurofibromatosis Brother   . Sickle cell trait Brother     Social History Social History   Tobacco Use  . Smoking status: Current Every Day Smoker    Packs/day: 0.50    Types: Cigarettes  . Smokeless tobacco: Never Used  Substance Use Topics  . Alcohol use: No    Alcohol/week: 0.0 standard drinks  . Drug use: No    Review of Systems  Review of Systems  Unable to perform ROS: Mental status change      ____________________________________________  PHYSICAL EXAM:      VITAL SIGNS: ED Triage Vitals  Enc Vitals Group     BP      Pulse      Resp      Temp      Temp src      SpO2      Weight      Height      Head Circumference      Peak Flow      Pain Score      Pain Loc      Pain Edu?      Excl. in [place]?      Physical Exam Vitals and nursing note reviewed.  Constitutional:      General: She is in acute distress.     Appearance: She is well-developed.  HENT:     Head: Normocephalic and atraumatic.     Mouth/Throat:     Mouth: Mucous membranes are dry.  Eyes:     Conjunctiva/sclera: Conjunctivae normal.  Cardiovascular:     Rate and Rhythm: Regular rhythm. Tachycardia present.     Heart sounds: Normal heart sounds. No murmur. No friction rub.  Pulmonary:     Effort: Pulmonary effort is normal. No respiratory distress.     Breath sounds: Normal breath sounds. No wheezing or rales.  Abdominal:     General: There is no distension.     Palpations: Abdomen is soft.     Tenderness: There is no abdominal tenderness.  Musculoskeletal:     Cervical back: Neck supple.  Skin:    General: Skin is warm.     Capillary Refill: Capillary refill takes less than 2 seconds.  Neurological:     Mental Status: She is alert.     Motor: No abnormal muscle tone.     Comments: LUE and LLE rhythmic contractions with eyes deviated to right, does not blink to threat. Increased tone throughout LUE>RUE. Does not follow commands but will occasionally grunt and orient to questions, when shaking improves. No generalized/bilateral activity noted persistently.       ____________________________________________   LABS (all labs ordered are listed, but only abnormal results are displayed)  Labs Reviewed  COMPREHENSIVE METABOLIC PANEL - Abnormal; Notable for the following components:      Result Value   Potassium 2.9 (*)    Glucose, Bld 107 (*)    All other components within normal limits   URINALYSIS, COMPLETE (UACMP) WITH MICROSCOPIC - Abnormal; Notable for the following components:   Color, Urine STRAW (*)    APPearance CLEAR (*)    Ketones, ur 5 (*)    All other components within normal limits  SARS CORONAVIRUS 2 BY RT PCR (HOSPITAL ORDER, [place] [facility])  MAGNESIUM  CK  CBC WITH DIFFERENTIAL/PLATELET  HCG, QUANTITATIVE, PREGNANCY  POC URINE PREG, ED  POCT PREGNANCY, URINE  CBG MONITORING, ED    ____________________________________________  EKG: Sinus tachycardia, ventricular rate 102.  QRS 93, QTc 446.  No acute ST elevations or depressions.  No EKG evidence of acute ischemia or infarct. ________________________________________  RADIOLOGY All imaging, including plain films, CT scans, and ultrasounds, independently reviewed by me, and interpretations confirmed via formal radiology reads.  ED MD interpretation:   CT: Pending  Official radiology report(s): No results found.  ____________________________________________  PROCEDURES   Procedure(s) performed (including Critical Care):  Procedures  ____________________________________________  INITIAL IMPRESSION / MDM / [place] / ED COURSE  As part of my medical decision making, I reviewed the following data within the [place] notes reviewed and incorporated, Old chart reviewed, Notes from prior ED visits, and [place] Controlled Substance Database       *AKARA NEEDLES was evaluated in Emergency Department on 03/09/2020 for the symptoms described in the history of present illness. She was evaluated in the context of the global COVID-19 pandemic, which necessitated consideration that the patient might be at risk for infection with the SARS-CoV-2 virus that causes COVID-19. Institutional protocols and algorithms that pertain to the evaluation of patients at risk for COVID-19 are in a state of rapid change based on information released by regulatory  bodies including the CDC and federal and state organizations. These policies and algorithms were followed during the patient's care in the ED.  Some ED evaluations and interventions may be delayed as a result of limited staffing during the pandemic.*     Medical Decision Making:  39 yo F here with seizure-like activity. On arrival, pt with rhythmic contractions of left side, occasional response to questions raising question of partial status vs PNES. Unable to find any description of her prior seizures in review of records. Ativan, Keppra given and will check basic labs, CT, and ask Neuro to assess. Protecting airway without signs of significant resp compromise at this time. VSS.  Patient care transferred to Dr. Charna Archer at the end of my shift. Patient presentation, ED course, and plan of care discussed with review of all pertinent labs and imaging. Please see his/her note for further details regarding further ED course and disposition.   ____________________________________________  FINAL CLINICAL IMPRESSION(S) / ED DIAGNOSES  Final diagnoses:  Seizure-like activity ([place])     MEDICATIONS GIVEN DURING THIS VISIT:  Medications  potassium chloride 10 mEq in 100 mL IVPB (10 mEq Intravenous New Bag/Given 03/09/20 1556)  LORazepam (ATIVAN) injection 2 mg ( Intravenous Not Given 03/09/20 1510)  levETIRAcetam (KEPPRA) 2,000 mg in sodium chloride 0.9 % 250 mL IVPB (0 mg Intravenous Stopped 03/09/20 1626)     ED Discharge Orders    None       Note:  This document was prepared using Dragon voice recognition software and may include unintentional dictation errors.   Duffy Bruce, MD 03/09/20 [phone]

## 2020-03-10 ENCOUNTER — Encounter: Payer: Self-pay | Admitting: Family Medicine

## 2020-03-10 ENCOUNTER — Other Ambulatory Visit: Payer: Self-pay

## 2020-03-10 ENCOUNTER — Observation Stay: Payer: Medicare Other

## 2020-03-10 DIAGNOSIS — D649 Anemia, unspecified: Secondary | ICD-10-CM | POA: Diagnosis not present

## 2020-03-10 DIAGNOSIS — E876 Hypokalemia: Secondary | ICD-10-CM | POA: Diagnosis not present

## 2020-03-10 DIAGNOSIS — Q8500 Neurofibromatosis, unspecified: Secondary | ICD-10-CM | POA: Diagnosis not present

## 2020-03-10 DIAGNOSIS — R569 Unspecified convulsions: Secondary | ICD-10-CM | POA: Diagnosis not present

## 2020-03-10 LAB — FOLATE: Folate: 13.7 ng/mL (ref >5.9)

## 2020-03-10 LAB — IRON AND TIBC
Iron: 18 ug/dL — ABNORMAL LOW (ref 28–170)
Saturation Ratios: 5 % — ABNORMAL LOW (ref 10.4–31.8)
TIBC: 395 ug/dL (ref 250–450)
UIBC: 377 ug/dL

## 2020-03-10 LAB — VITAMIN B12: Vitamin B-12: 238 pg/mL (ref 180–914)

## 2020-03-10 LAB — POTASSIUM: Potassium: 3.4 mmol/L — ABNORMAL LOW (ref 3.5–5.1)

## 2020-03-10 LAB — HIV ANTIBODY (ROUTINE TESTING W REFLEX): HIV Screen 4th Generation wRfx: NONREACTIVE

## 2020-03-10 MED ORDER — POTASSIUM CHLORIDE CRYS ER 20 MEQ PO TBCR
40.0000 meq | EXTENDED_RELEASE_TABLET | Freq: Once | ORAL | Status: AC
  Administered 2020-03-10: 40 meq via ORAL
  Filled 2020-03-10: qty 2

## 2020-03-10 MED ORDER — IOHEXOL 350 MG/ML SOLN
75.0000 mL | Freq: Once | INTRAVENOUS | Status: AC | PRN
  Administered 2020-03-10: 75 mL via INTRAVENOUS

## 2020-03-10 MED ORDER — PROMETHAZINE HCL 25 MG/ML IJ SOLN
6.2500 mg | Freq: Once | INTRAMUSCULAR | Status: AC
  Administered 2020-03-10: 6.25 mg via INTRAVENOUS
  Filled 2020-03-10: qty 1

## 2020-03-10 MED ORDER — CYCLOBENZAPRINE HCL 10 MG PO TABS
10.0000 mg | ORAL_TABLET | Freq: Once | ORAL | Status: AC
  Administered 2020-03-10: 10 mg via ORAL
  Filled 2020-03-10: qty 1

## 2020-03-10 NOTE — Progress Notes (Signed)
Subjective: No reports of any further seizure activity overnight.  Patient reports that she has muscle aching all over.    Objective: Current vital signs: BP 121/63   Pulse 68   Temp 98.8 F (37.1 C) (Axillary)   Resp 18   Ht 5\' 5"  (1.651 m)   Wt 90.7 kg   SpO2 100%   BMI 33.28 kg/m  Vital signs in last 24 hours: Temp:  [98.8 F (37.1 C)] 98.8 F (37.1 C) (05/25 1454) Pulse Rate:  [68-99] 68 (05/26 0944) Resp:  [14-28] 18 (05/26 0944) BP: (111-136)/(63-88) 121/63 (05/26 0944) SpO2:  [97 %-100 %] 100 % (05/26 0944) Weight:  [90.7 kg] 90.7 kg (05/26 0736)  Intake/Output from previous day: 05/25 0701 - 05/26 0700 In: 300 [IV Piggyback:300] Out: 500 [Urine:500] Intake/Output this shift: No intake/output data recorded. Nutritional status:  Diet Order            Diet regular Room service appropriate? Yes; Fluid consistency: Thin  Diet effective now              Neurologic Exam: Mental Status: Alert, oriented, thought content appropriate.  Speech fluent without evidence of aphasia.  Able to follow 3 step commands without difficulty. Cranial Nerves: II: Discs flat bilaterally; Visual fields grossly normal, pupils equal, round, reactive to light and accommodation III,IV, VI: ptosis not present, extra-ocular motions intact bilaterally V,VII: mild right facial droop, facial light touch sensation normal bilaterally VIII: hearing normal bilaterally IX,X: gag reflex present XI: bilateral shoulder shrug XII: midline tongue extension Motor: Patient able to lift all extremities off the bed Sensory: Pinprick and light touch decreased in the RUE   Lab Results: Basic Metabolic Panel: Recent Labs  Lab 03/09/20 1452 03/09/20 2150  NA 140  --   K 2.9*  --   CL 108  --   CO2 23  --   GLUCOSE 107*  --   BUN 8  --   CREATININE 0.65  --   CALCIUM 8.9  --   MG 1.9  --   PHOS  --  2.5    Liver Function Tests: Recent Labs  Lab 03/09/20 1452  AST 24  ALT 23  ALKPHOS  63  BILITOT 0.4  PROT 7.3  ALBUMIN 4.1   No results for input(s): LIPASE, AMYLASE in the last 168 hours. No results for input(s): AMMONIA in the last 168 hours.  CBC: Recent Labs  Lab 03/09/20 1452  WBC 7.3  NEUTROABS 4.3  HGB 8.8*  HCT 31.8*  MCV 59.8*  PLT 460*    Cardiac Enzymes: Recent Labs  Lab 03/09/20 1452  CKTOTAL 185    Lipid Panel: No results for input(s): CHOL, TRIG, HDL, CHOLHDL, VLDL, LDLCALC in the last 168 hours.  CBG: No results for input(s): GLUCAP in the last 168 hours.  Microbiology: Results for orders placed or performed during the hospital encounter of 03/09/20  SARS Coronavirus 2 by RT PCR (hospital order, performed in [facility] hospital lab) Nasopharyngeal Nasopharyngeal Swab     Status: None   Collection Time: 03/09/20  4:10 PM   Specimen: Nasopharyngeal Swab  Result Value Ref Range Status   SARS Coronavirus 2 NEGATIVE NEGATIVE Final    Comment: (NOTE) SARS-CoV-2 target nucleic acids are NOT DETECTED. The SARS-CoV-2 RNA is generally detectable in upper and lower respiratory specimens during the acute phase of infection. The lowest concentration of SARS-CoV-2 viral copies this assay can detect is 250 copies / mL. A negative result does not  preclude SARS-CoV-2 infection and should not be used as the sole basis for treatment or other patient management decisions.  A negative result may occur with improper specimen collection / handling, submission of specimen other than nasopharyngeal swab, presence of viral mutation(s) within the areas targeted by this assay, and inadequate number of viral copies (<250 copies / mL). A negative result must be combined with clinical observations, patient history, and epidemiological information. Fact Sheet for Patients:   StrictlyIdeas.no Fact Sheet for Healthcare Providers: BankingDealers.co.za This test is not yet approved or cleared  by the [place]  FDA and has been authorized for detection and/or diagnosis of SARS-CoV-2 by FDA under an Emergency Use Authorization (EUA).  This EUA will remain in effect (meaning this test can be used) for the duration of the COVID-19 declaration under Section 564(b)(1) of the Act, 21 U.S.C. section 360bbb-3(b)(1), unless the authorization is terminated or revoked sooner. Performed at [facility], [address] [place], [place] [address]     Coagulation Studies: No results for input(s): LABPROT, INR in the last 72 hours.  Imaging: CT Head Wo Contrast  Result Date: 03/09/2020 CLINICAL DATA:  Seizure. Chronic headaches. EXAM: CT HEAD WITHOUT CONTRAST TECHNIQUE: Contiguous axial images were obtained from the base of the skull through the vertex without intravenous contrast. COMPARISON:  07/25/2008 FINDINGS: Brain: No evidence of acute infarction, hemorrhage, hydrocephalus, extra-axial collection, or mass lesion/mass effect. Vascular:  No hyperdense vessel or other acute findings. Skull: No evidence of fracture or other significant bone abnormality. Sinuses/Orbits:  No acute findings. Other: None. IMPRESSION: Negative noncontrast head CT. Electronically Signed   By: Marlaine Hind M.D.   On: 03/09/2020 18:11   MR BRAIN W WO CONTRAST  Result Date: 03/09/2020 CLINICAL DATA:  Encephalopathy. History of neurofibromatosis type 1 with seizures and chronic headaches. EXAM: MRI HEAD WITHOUT AND WITH CONTRAST MRI CERVICAL SPINE WITHOUT AND WITH CONTRAST TECHNIQUE: Multiplanar, multiecho pulse sequences of the brain and surrounding structures, and cervical spine, to include the craniocervical junction and cervicothoracic junction, were obtained without and with intravenous contrast. CONTRAST:  2mL GADAVIST GADOBUTROL 1 MMOL/ML IV SOLN COMPARISON:  Cervical spine MRI 10/30/2018 FINDINGS: MRI HEAD FINDINGS Brain: No acute infarct, acute hemorrhage or extra-axial collection. Normal white matter signal. Normal  volume of CSF spaces. Single focus of magnetic susceptibility effect in the medial left cerebellum at the dentate nucleus. Normal midline structures. Vascular: Abnormal right internal carotid artery flow void. Skull and upper cervical spine: Normal calvarium. Sinuses/Orbits: Paranasal sinuses are clear. Normal orbits. Other: None MRI CERVICAL SPINE FINDINGS Alignment: Reversal of normal cervical lordosis. No static subluxation. Vertebrae: No fracture, evidence of discitis, or bone lesion. Cord: Normal signal and morphology. Posterior Fossa, vertebral arteries, paraspinal tissues: Negative Disc levels: No disc herniation. No spinal canal or neural foraminal stenosis. IMPRESSION: 1. No acute intracranial abnormality. 2. Abnormal right internal carotid artery flow void, consistent with occlusion or flow-limiting stenosis. Narrowing of the right ICA was described in an outside MRI report from 01/15/2010. Correlation with vascular ultrasound or CTA/MRA of the neck with contrast may be helpful. 3. 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 4. Normal cervical spine MRI. Electronically Signed   By: Ulyses Jarred M.D.   On: 03/09/2020 22:48   MR CERVICAL SPINE W WO CONTRAST  Result Date: 03/09/2020 CLINICAL DATA:  Encephalopathy. History of neurofibromatosis type 1 with seizures and chronic headaches. EXAM: MRI HEAD WITHOUT AND  WITH CONTRAST MRI CERVICAL SPINE WITHOUT AND WITH CONTRAST TECHNIQUE: Multiplanar, multiecho pulse sequences of the brain and surrounding structures, and cervical spine, to include the craniocervical junction and cervicothoracic junction, were obtained without and with intravenous contrast. CONTRAST:  27mL GADAVIST GADOBUTROL 1 MMOL/ML IV SOLN COMPARISON:  Cervical spine MRI 10/30/2018 FINDINGS: MRI HEAD FINDINGS Brain: No acute infarct, acute hemorrhage or extra-axial collection. Normal white matter signal. Normal  volume of CSF spaces. Single focus of magnetic susceptibility effect in the medial left cerebellum at the dentate nucleus. Normal midline structures. Vascular: Abnormal right internal carotid artery flow void. Skull and upper cervical spine: Normal calvarium. Sinuses/Orbits: Paranasal sinuses are clear. Normal orbits. Other: None MRI CERVICAL SPINE FINDINGS Alignment: Reversal of normal cervical lordosis. No static subluxation. Vertebrae: No fracture, evidence of discitis, or bone lesion. Cord: Normal signal and morphology. Posterior Fossa, vertebral arteries, paraspinal tissues: Negative Disc levels: No disc herniation. No spinal canal or neural foraminal stenosis. IMPRESSION: 1. No acute intracranial abnormality. 2. Abnormal right internal carotid artery flow void, consistent with occlusion or flow-limiting stenosis. Narrowing of the right ICA was described in an outside MRI report from 01/15/2010. Correlation with vascular ultrasound or CTA/MRA of the neck with contrast may be helpful. 3. 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 4. Normal cervical spine MRI. Electronically Signed   By: Ulyses Jarred M.D.   On: 03/09/2020 22:48    Medications:  I have reviewed the patient's current medications. Scheduled: . bupivacaine (PF)  30 mL Other Once  . enoxaparin (LOVENOX) injection  40 mg Subcutaneous Q24H  . ferrous sulfate  325 mg Oral Q breakfast  . gabapentin  900 mg Oral TID  . levETIRAcetam  1,500 mg Oral BID  . midazolam  5 mg Intravenous Once  . sodium chloride flush  20 mL Other Once  . sodium chloride flush  20 mL Other Once  . topiramate  25 mg Oral QHS  . triamcinolone acetonide  40 mg Other Once    Assessment/Plan: 39 y.o. female with a history of NF1 and seizures who presents with seizure activity.  The patient reports that she has not had a seizure since 2016.  On Keppra and reports compliance.  Per EMS  patient was noted to have seizure activity on their arrival.  GTC activity was described and patient received 6mg  Versed en route.  In ED noted to have further seizure like activity but with features concerning for nonepileptic events.  Patient on Keppra 1500mg  BID, Neurontin and Topamax.  Serum phosphorus and magnesium are normal.  Also describes RUE numbness that is new over the past couple of months. From review of records it seems she was having some neck pain last year but it is unclear if she ever had her imaging or NS evaluation.    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  Patient asymptomatic from that at this time.  MR of the cervical spine is unremarkable.       Recommendations: 1. Vascular evaluation for RICA stenosis 2. Continue Topamax at 25mg  q hs and Neurontin and Keppra at home doses 3. RUE numbness continues.  Patient to follow up with neurology on an outpatient basis and consideration for NCV/EMG can be made at that time 4. If seizure like activity continues patient may need  EMU monitoring    LOS: 0 days   Alexis Goodell, MD Neurology [phone] 03/10/2020  10:27 AM

## 2020-03-10 NOTE — ED Notes (Signed)
Pt continues sleeping. Upon assessment pt is kicking but it is not seizure like in nature.

## 2020-03-10 NOTE — Progress Notes (Signed)
PROGRESS NOTE    Valerie Mckinney  [id number] DOB: Jul 14, 1981 DOA: 03/09/2020 PCP: Herminio Commons, MD    Assessment & Plan:   Principal Problem:   Seizure-like activity [facility]) Active Problems:   Neurofibromatosis ([place])   Anemia   Thrombocytosis ([place])   Hypokalemia    Valerie Mckinney is a 39 y.o. AA female with medical history significant for seizure on Keppra, chronic migraine, neurofibromatosis type I, congenital heart disease who presented with concerns of seizure-like activity.   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   --Neuro consulted  PLAN: --Continue Topamax at 25mg  q hs and Neurontin and Keppra at home doses, per neuro --If seizure like activity continues patient may need EMU monitoring  Right ICA stenosis --MR brain showed "Abnormal right internal carotid artery flow void, consistent with occlusion or flow-limiting stenosis" and "MRA shows a high grade right ICA stenosis present in 2011" PLAN: --carotid US indicated no significant plaque --Vascular consult --CTA head and neck for confirmatory study  Chronic anemia Stable  Thrombocytosis  Unclear etiology but could be reactive  Hypokalemia Replete PRN  Neurofibromatosis Patient complains of pain all over. --Ibuprofen PRN for mild pain --start Flexeril   DVT prophylaxis: Lovenox SQ Code Status: Full code  Family Communication: husband updated at bedside today Status is: observation Dispo:   The patient is from: home Anticipated d/c is to: home Anticipated d/c date is: tomorrow Patient currently is not medically stable to d/c due to: still being workup for right ICA stenosis by neuro and vascular surgery   Subjective and Interval History:  Pt reported whole-body soreness.  No more seizure-like events.  No fever, dyspnea, chest pain, abdominal pain, N/V/D.      Objective: Vitals:   03/10/20 0736 03/10/20 0944 03/10/20 1500 03/10/20 1653  BP:  121/63 120/65 138/76  Pulse:  68 70 (!) 59  Resp:  18 18 15   Temp:    98.4 F (36.9 C)  TempSrc:    Oral  SpO2:  100% 100% 100%  Weight: 90.7 kg     Height: 5\' 5"  (1.651 m)       Intake/Output Summary (Last 24 hours) at 03/10/2020 2237 Last data filed at 03/09/2020 2345 Gross per 24 hour  Intake --  Output 500 ml  Net -500 ml   Filed Weights   03/09/20 1456 03/10/20 0736  Weight: 90.7 kg 90.7 kg    Examination:   Constitutional: NAD, AAOx3 HEENT: conjunctivae and lids normal, EOMI CV: RRR no M,R,G. Distal pulses +2.  No cyanosis.   RESP: CTA B/L, normal respiratory effort  GI: +BS, NTND Extremities: No effusions, edema, or tenderness in BLE SKIN: warm, dry and intact Neuro: II - XII grossly intact.  Sensation intact Psych: Normal mood and affect.     Data Reviewed: I have personally reviewed following labs and imaging studies  CBC: Recent Labs  Lab 03/09/20 1452  WBC 7.3  NEUTROABS 4.3  HGB 8.8*  HCT 31.8*  MCV 59.8*  PLT 123456*   Basic Metabolic Panel: Recent Labs  Lab 03/09/20 1452 03/09/20 2150 03/10/20 0925  NA 140  --   --   K 2.9*  --  3.4*  CL 108  --   --   CO2 23  --   --   GLUCOSE 107*  --   --   BUN 8  --   --  CREATININE 0.65  --   --   CALCIUM 8.9  --   --   MG 1.9  --   --   PHOS  --  2.5  --    GFR: Estimated Creatinine Clearance: 108.2 mL/min (by C-G formula based on SCr of 0.65 mg/dL). Liver Function Tests: Recent Labs  Lab 03/09/20 1452  AST 24  ALT 23  ALKPHOS 63  BILITOT 0.4  PROT 7.3  ALBUMIN 4.1   No results for input(s): LIPASE, AMYLASE in the last 168 hours. No results for input(s): AMMONIA in the last 168 hours. Coagulation Profile: No results for input(s): INR, PROTIME in the last 168 hours. Cardiac Enzymes: Recent Labs  Lab 03/09/20 1452  CKTOTAL 185   BNP (last 3 results) No results for input(s): PROBNP in the  last 8760 hours. HbA1C: No results for input(s): HGBA1C in the last 72 hours. CBG: No results for input(s): GLUCAP in the last 168 hours. Lipid Profile: No results for input(s): CHOL, HDL, LDLCALC, TRIG, CHOLHDL, LDLDIRECT in the last 72 hours. Thyroid Function Tests: No results for input(s): TSH, T4TOTAL, FREET4, T3FREE, THYROIDAB in the last 72 hours. Anemia Panel: Recent Labs    03/10/20 0925  VITAMINB12 238  FOLATE 13.7  TIBC 395  IRON 18*   Sepsis Labs: No results for input(s): PROCALCITON, LATICACIDVEN in the last 168 hours.  Recent Results (from the past 240 hour(s))  SARS Coronavirus 2 by RT PCR (hospital order, performed in [facility] hospital lab) Nasopharyngeal Nasopharyngeal Swab     Status: None   Collection Time: 03/09/20  4:10 PM   Specimen: Nasopharyngeal Swab  Result Value Ref Range Status   SARS Coronavirus 2 NEGATIVE NEGATIVE Final    Comment: (NOTE) SARS-CoV-2 target nucleic acids are NOT DETECTED. The SARS-CoV-2 RNA is generally detectable in upper and lower respiratory specimens during the acute phase of infection. The lowest concentration of SARS-CoV-2 viral copies this assay can detect is 250 copies / mL. A negative result does not preclude SARS-CoV-2 infection and should not be used as the sole basis for treatment or other patient management decisions.  A negative result may occur with improper specimen collection / handling, submission of specimen other than nasopharyngeal swab, presence of viral mutation(s) within the areas targeted by this assay, and inadequate number of viral copies (<250 copies / mL). A negative result must be combined with clinical observations, patient history, and epidemiological information. Fact Sheet for Patients:   StrictlyIdeas.no Fact Sheet for Healthcare Providers: BankingDealers.co.za This test is not yet approved or cleared  by the [place] FDA and has been  authorized for detection and/or diagnosis of SARS-CoV-2 by FDA under an Emergency Use Authorization (EUA).  This EUA will remain in effect (meaning this test can be used) for the duration of the COVID-19 declaration under Section 564(b)(1) of the Act, 21 U.S.C. section 360bbb-3(b)(1), unless the authorization is terminated or revoked sooner. Performed at [facility], [address] [place], [place] [address]       Radiology Studies: CT Head Wo Contrast  Result Date: 03/09/2020 CLINICAL DATA:  Seizure. Chronic headaches. EXAM: CT HEAD WITHOUT CONTRAST TECHNIQUE: Contiguous axial images were obtained from the base of the skull through the vertex without intravenous contrast. COMPARISON:  07/25/2008 FINDINGS: Brain: No evidence of acute infarction, hemorrhage, hydrocephalus, extra-axial collection, or mass lesion/mass effect. Vascular:  No hyperdense vessel or other acute findings. Skull: No evidence of fracture or other significant bone abnormality. Sinuses/Orbits:  No acute  findings. Other: None. IMPRESSION: Negative noncontrast head CT. Electronically Signed   By: Marlaine Hind M.D.   On: 03/09/2020 18:11   MR BRAIN W WO CONTRAST  Result Date: 03/09/2020 CLINICAL DATA:  Encephalopathy. History of neurofibromatosis type 1 with seizures and chronic headaches. EXAM: MRI HEAD WITHOUT AND WITH CONTRAST MRI CERVICAL SPINE WITHOUT AND WITH CONTRAST TECHNIQUE: Multiplanar, multiecho pulse sequences of the brain and surrounding structures, and cervical spine, to include the craniocervical junction and cervicothoracic junction, were obtained without and with intravenous contrast. CONTRAST:  21mL GADAVIST GADOBUTROL 1 MMOL/ML IV SOLN COMPARISON:  Cervical spine MRI 10/30/2018 FINDINGS: MRI HEAD FINDINGS Brain: No acute infarct, acute hemorrhage or extra-axial collection. Normal white matter signal. Normal volume of CSF spaces. Single focus of magnetic susceptibility effect in the medial left  cerebellum at the dentate nucleus. Normal midline structures. Vascular: Abnormal right internal carotid artery flow void. Skull and upper cervical spine: Normal calvarium. Sinuses/Orbits: Paranasal sinuses are clear. Normal orbits. Other: None MRI CERVICAL SPINE FINDINGS Alignment: Reversal of normal cervical lordosis. No static subluxation. Vertebrae: No fracture, evidence of discitis, or bone lesion. Cord: Normal signal and morphology. Posterior Fossa, vertebral arteries, paraspinal tissues: Negative Disc levels: No disc herniation. No spinal canal or neural foraminal stenosis. IMPRESSION: 1. No acute intracranial abnormality. 2. Abnormal right internal carotid artery flow void, consistent with occlusion or flow-limiting stenosis. Narrowing of the right ICA was described in an outside MRI report from 01/15/2010. Correlation with vascular ultrasound or CTA/MRA of the neck with contrast may be helpful. 3. 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 4. Normal cervical spine MRI. Electronically Signed   By: Ulyses Jarred M.D.   On: 03/09/2020 22:48   MR CERVICAL SPINE W WO CONTRAST  Result Date: 03/09/2020 CLINICAL DATA:  Encephalopathy. History of neurofibromatosis type 1 with seizures and chronic headaches. EXAM: MRI HEAD WITHOUT AND WITH CONTRAST MRI CERVICAL SPINE WITHOUT AND WITH CONTRAST TECHNIQUE: Multiplanar, multiecho pulse sequences of the brain and surrounding structures, and cervical spine, to include the craniocervical junction and cervicothoracic junction, were obtained without and with intravenous contrast. CONTRAST:  74mL GADAVIST GADOBUTROL 1 MMOL/ML IV SOLN COMPARISON:  Cervical spine MRI 10/30/2018 FINDINGS: MRI HEAD FINDINGS Brain: No acute infarct, acute hemorrhage or extra-axial collection. Normal white matter signal. Normal volume of CSF spaces. Single focus of magnetic susceptibility effect in the medial left  cerebellum at the dentate nucleus. Normal midline structures. Vascular: Abnormal right internal carotid artery flow void. Skull and upper cervical spine: Normal calvarium. Sinuses/Orbits: Paranasal sinuses are clear. Normal orbits. Other: None MRI CERVICAL SPINE FINDINGS Alignment: Reversal of normal cervical lordosis. No static subluxation. Vertebrae: No fracture, evidence of discitis, or bone lesion. Cord: Normal signal and morphology. Posterior Fossa, vertebral arteries, paraspinal tissues: Negative Disc levels: No disc herniation. No spinal canal or neural foraminal stenosis. IMPRESSION: 1. No acute intracranial abnormality. 2. Abnormal right internal carotid artery flow void, consistent with occlusion or flow-limiting stenosis. Narrowing of the right ICA was described in an outside MRI report from 01/15/2010. Correlation with vascular ultrasound or CTA/MRA of the neck with contrast may be helpful. 3. 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 4. Normal cervical spine MRI. Electronically Signed   By: Ulyses Jarred M.D.   On: 03/09/2020 22:48   US Carotid Bilateral  Result Date: 03/10/2020 CLINICAL DATA:  39 year old female with  a history right ICA stenosis EXAM: BILATERAL CAROTID DUPLEX ULTRASOUND TECHNIQUE: Pearline Cables scale imaging, color Doppler and duplex ultrasound were performed of bilateral carotid and vertebral arteries in the neck. COMPARISON:  None. FINDINGS: Criteria: Quantification of carotid stenosis is based on velocity parameters that correlate the residual internal carotid diameter with NASCET-based stenosis levels, using the diameter of the distal internal carotid lumen as the denominator for stenosis measurement. The following velocity measurements were obtained: RIGHT ICA:  Systolic 63 cm/sec, Diastolic 15 cm/sec CCA:  85 cm/sec SYSTOLIC ICA/CCA RATIO:  0.7 ECA:  63 cm/sec LEFT ICA:  Systolic 83 cm/sec, Diastolic 41  cm/sec CCA:  50 cm/sec SYSTOLIC ICA/CCA RATIO:  1.7 ECA:   cm/sec Right Brachial SBP: Not acquired Left Brachial SBP: Not acquired RIGHT CAROTID ARTERY: Unremarkable appearance of the carotid system without significant atherosclerotic disease. Intermediate waveform of the CCA. Low resistance waveform of the ICA. No significant tortuosity of the carotid system. RIGHT VERTEBRAL ARTERY: Antegrade flow with low resistance waveform. LEFT CAROTID ARTERY: Unremarkable appearance of the carotid system without significant atherosclerotic disease. Intermediate waveform of the CCA. Low resistance waveform of the ICA. No significant tortuosity of the carotid system. LEFT VERTEBRAL ARTERY:  Antegrade flow with low resistance waveform. IMPRESSION: Color duplex indicates no significant plaque, with no hemodynamically significant stenosis by duplex criteria in the extracranial cerebrovascular circulation. Signed, Dulcy Fanny. Dellia Nims, RPVI [facility] Electronically Signed   By: Corrie Mckusick D.O.   On: 03/10/2020 17:10     Scheduled Meds: . enoxaparin (LOVENOX) injection  40 mg Subcutaneous Q24H  . ferrous sulfate  325 mg Oral Q breakfast  . gabapentin  900 mg Oral TID  . levETIRAcetam  1,500 mg Oral BID  . topiramate  25 mg Oral QHS   Continuous Infusions:   LOS: 0 days     Enzo Bi, MD [facility] If 7PM-7AM, please contact night-coverage 03/10/2020, 10:37 PM

## 2020-03-10 NOTE — ED Notes (Signed)
Pt given breakfast tray

## 2020-03-10 NOTE — ED Notes (Signed)
Pt given lunch

## 2020-03-11 DIAGNOSIS — Q8500 Neurofibromatosis, unspecified: Secondary | ICD-10-CM | POA: Diagnosis not present

## 2020-03-11 DIAGNOSIS — E876 Hypokalemia: Secondary | ICD-10-CM | POA: Diagnosis not present

## 2020-03-11 DIAGNOSIS — D649 Anemia, unspecified: Secondary | ICD-10-CM | POA: Diagnosis not present

## 2020-03-11 DIAGNOSIS — R569 Unspecified convulsions: Secondary | ICD-10-CM | POA: Diagnosis not present

## 2020-03-11 LAB — CBC
HCT: 28.9 % — ABNORMAL LOW (ref 36.0–46.0)
Hemoglobin: 8.3 g/dL — ABNORMAL LOW (ref 12.0–15.0)
MCH: 16.7 pg — ABNORMAL LOW (ref 26.0–34.0)
MCHC: 28.7 g/dL — ABNORMAL LOW (ref 30.0–36.0)
MCV: 58.1 fL — ABNORMAL LOW (ref 80.0–100.0)
Platelets: 432 10*3/uL — ABNORMAL HIGH (ref 150–400)
RBC: 4.97 MIL/uL (ref 3.87–5.11)
RDW: 21.4 % — ABNORMAL HIGH (ref 11.5–15.5)
WBC: 7.8 10*3/uL (ref 4.0–10.5)
nRBC: 0 % (ref 0.0–0.2)

## 2020-03-11 LAB — BASIC METABOLIC PANEL
Anion gap: 7 (ref 5–15)
BUN: 9 mg/dL (ref 6–20)
CO2: 22 mmol/L (ref 22–32)
Calcium: 8.7 mg/dL — ABNORMAL LOW (ref 8.9–10.3)
Chloride: 111 mmol/L (ref 98–111)
Creatinine, Ser: 0.51 mg/dL (ref 0.44–1.00)
GFR calc Af Amer: >60 mL/min (ref >60)
GFR calc non Af Amer: >60 mL/min (ref >60)
Glucose, Bld: 94 mg/dL (ref 70–99)
Potassium: 3.4 mmol/L — ABNORMAL LOW (ref 3.5–5.1)
Sodium: 140 mmol/L (ref 135–145)

## 2020-03-11 LAB — MAGNESIUM: Magnesium: 2 mg/dL (ref 1.7–2.4)

## 2020-03-11 MED ORDER — POTASSIUM CHLORIDE CRYS ER 20 MEQ PO TBCR
40.0000 meq | EXTENDED_RELEASE_TABLET | Freq: Once | ORAL | Status: AC
  Administered 2020-03-11: 40 meq via ORAL
  Filled 2020-03-11: qty 2

## 2020-03-11 MED ORDER — ALBUTEROL SULFATE (2.5 MG/3ML) 0.083% IN NEBU
2.5000 mg | INHALATION_SOLUTION | RESPIRATORY_TRACT | Status: DC | PRN
  Administered 2020-03-11: 09:00:00 2.5 mg via RESPIRATORY_TRACT
  Filled 2020-03-11: qty 3

## 2020-03-11 MED ORDER — ALBUTEROL SULFATE HFA 108 (90 BASE) MCG/ACT IN AERS: 1.0000 | INHALATION_SPRAY | RESPIRATORY_TRACT | Status: DC | PRN

## 2020-03-11 MED ORDER — TOPIRAMATE 25 MG PO TABS: 25.0000 mg | ORAL_TABLET | Freq: Every day | ORAL | 2 refills | Status: AC

## 2020-03-11 MED ORDER — EQL SLOW RELEASE IRON 160 (50 FE) MG PO TBCR: 160.0000 mg | EXTENDED_RELEASE_TABLET | Freq: Every day | ORAL | Status: AC

## 2020-03-11 NOTE — Progress Notes (Signed)
Pt being discharged home, discharge instructions reviewed with pt by Trena Platt RN, states understanding, pt with no complaints, trying to find a ride for transport

## 2020-03-11 NOTE — Discharge Summary (Addendum)
Physician Discharge Summary   Valerie Mckinney  female DOB: 07-11-1981  [id number]  PCP: Herminio Commons, MD  Admit date: 03/09/2020 Discharge date: 03/11/2020  Admitted From: home Disposition:  home CODE STATUS: Full code  Discharge Instructions    Discharge instructions   Complete by: As directed    You have some abnormal finding of some of the arteries in your brain.  It's not acute and does not need urgent intervention, but please follow up with neurosurgery Dr Augusto Garbe at [facility].       Hospital Course:  For full details, please see H&P, progress notes, consult notes and ancillary notes.  Briefly,  Valerie Chhay Albrightis a 39 y.o.AA femalewith medical history significant forseizure on Keppra, chronic migraine, neurofibromatosis type I, congenital heart disease who presented with concerns of seizure-like activity.   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  Neuro consulted and recommended continuging Topamax at25mg  qhs and Neurontin and Keppra at home doses.  Right ICA stenosis, chronic MR brain showed "Abnormal right internal carotid artery flow void, consistent with occlusion or flow-limiting stenosis" and "MRA shows a high grade right ICA stenosis present in 2011".  Vascular consulted per neuro recommendation.  CTA head/neck ordered.  Vascular surgery recommended outpatient followup with neurosurgery Dr Augusto Garbe at [facility].  Chronic anemia Stable  Thrombocytosis  Unclear etiology but could be reactive  Hypokalemia Replete PRN  Neurofibromatosis Patient complained of pain all over. Ibuprofen and Flexeril PRN for mild pain.    Discharge Diagnoses:  Principal Problem:   Seizure-like activity ([place]) Active Problems:   Neurofibromatosis ([place])   Anemia   Thrombocytosis ([facility])   Hypokalemia    Discharge Instructions:  Allergies  as of 03/11/2020      Reactions   Acetaminophen Other (See Comments)   seizures "Trigger for my seizures" Other reaction(s): Seizures   Cherry Flavor Swelling   Diphenhydramine Hcl Other (See Comments)   CAUSES SEIZURES seizure Other reaction(s): Seizures   Flagyl [metronidazole] Anaphylaxis, Hives   Throat swells up   Tape Hives   Zofran [ondansetron Hcl] Anaphylaxis   Throat swells up   Amitriptyline Other (See Comments)   Kidney failure      Medication List    STOP taking these medications   medroxyPROGESTERone 10 MG tablet Commonly known as: Provera     TAKE these medications   albuterol 108 (90 Base) MCG/ACT inhaler Commonly known as: VENTOLIN HFA Inhale 1-2 puffs into the lungs every 4 (four) hours as needed for shortness of breath or wheezing.   EQL Slow Release Iron 160 (50 Fe) MG Tbcr SR tablet Generic drug: ferrous sulfate Take 1 tablet (160 mg total) by mouth daily. What changed: when to take this   gabapentin 300 MG capsule Commonly known as: NEURONTIN Take 900 mg by mouth 3 (three) times daily.   ibuprofen 200 MG tablet Commonly known as: ADVIL Take 400 mg by mouth every 6 (six) hours as needed.   levETIRAcetam 500 MG tablet Commonly known as: KEPPRA Take 1,500 mg by mouth 2 (two) times daily.   nortriptyline 10 MG capsule Commonly known as: PAMELOR Take 20 mg by mouth at bedtime.   topiramate 25 MG tablet Commonly known as: TOPAMAX Take 1 tablet (25 mg total) by mouth at bedtime.       Follow-up Information    Augusto Garbe, MD Follow up.   Specialty: Neurosurgery  Contact information: [place] [phone]           Allergies  Allergen Reactions  . Acetaminophen Other (See Comments)    seizures "Trigger for my seizures" Other reaction(s): Seizures   . Cherry Flavor Swelling  . Diphenhydramine Hcl Other (See Comments)    CAUSES SEIZURES seizure Other reaction(s): Seizures     . Flagyl [Metronidazole] Anaphylaxis and Hives    Throat swells up  . Tape Hives  . Zofran [Ondansetron Hcl] Anaphylaxis    Throat swells up  . Amitriptyline Other (See Comments)    Kidney failure     The results of significant diagnostics from this hospitalization (including imaging, microbiology, ancillary and laboratory) are listed below for reference.   Consultations:   Procedures/Studies: CT ANGIO HEAD W OR WO CONTRAST  Result Date: 03/10/2020 CLINICAL DATA:  Carotid artery stenosis EXAM: CT ANGIOGRAPHY HEAD AND NECK TECHNIQUE: Multidetector CT imaging of the head and neck was performed using the standard protocol during bolus administration of intravenous contrast. Multiplanar CT image reconstructions and MIPs were obtained to evaluate the vascular anatomy. Carotid stenosis measurements (when applicable) are obtained utilizing NASCET criteria, using the distal internal carotid diameter as the denominator. CONTRAST:  87mL OMNIPAQUE IOHEXOL 350 MG/ML SOLN COMPARISON:  Brain MRI 03/09/2020 carotid duplex 03/10/2020 FINDINGS: CT HEAD FINDINGS Brain: There is no mass, hemorrhage or extra-axial collection. The size and configuration of the ventricles and extra-axial CSF spaces are normal. There is no acute or chronic infarction. The brain parenchyma is normal. Skull: The visualized skull base, calvarium and extracranial soft tissues are normal. Sinuses/Orbits: No fluid levels or advanced mucosal thickening of the visualized paranasal sinuses. No mastoid or middle ear effusion. The orbits are normal. CTA NECK FINDINGS SKELETON: There is no bony spinal canal stenosis. No lytic or blastic lesion. OTHER NECK: Normal pharynx, larynx and major salivary glands. No cervical lymphadenopathy. Unremarkable thyroid gland. UPPER CHEST: No pneumothorax or pleural effusion. No nodules or masses. AORTIC ARCH: There is no calcific atherosclerosis of the aortic arch. There is no aneurysm, dissection or  hemodynamically significant stenosis of the visualized portion of the aorta. Conventional 3 vessel aortic branching pattern. The visualized proximal subclavian arteries are widely patent. RIGHT CAROTID SYSTEM: There is qualitatively severe narrowing of the right internal carotid artery following abrupt tapering at its proximal aspect. However, by NASCET criteria, stenosis measures less than 50% due to the narrowed caliber of the distal ICA. LEFT CAROTID SYSTEM: Normal without aneurysm, dissection or stenosis. VERTEBRAL ARTERIES: Left dominant configuration. Both origins are clearly patent. There is no dissection, occlusion or flow-limiting stenosis to the skull base (V1-V3 segments). CTA HEAD FINDINGS POSTERIOR CIRCULATION: --Vertebral arteries: Normal V4 segments. --Inferior cerebellar arteries: Normal. --Basilar artery: Normal. --Superior cerebellar arteries: Normal. --Posterior cerebral arteries (PCA): Normal. The left PCA is predominantly supplied by the posterior communicating artery. ANTERIOR CIRCULATION: --Intracranial internal carotid arteries: Severe narrowing of the right ICA cavernous segment with possible occlusion at most distal aspect. The right carotid terminus is opacified via flow from a collateral pathway. There is a posteriorly traveling vessel from the right carotid terminus the passes through an area vascular irregularity (series 7 image 123). --Anterior cerebral arteries (ACA): Normal. Absent right A1 segment, normal variant --Middle cerebral arteries (MCA): Normal. VENOUS SINUSES: As permitted by contrast timing, patent. ANATOMIC VARIANTS: None Review of the MIP images confirms the above findings. IMPRESSION: 1. Qualitatively severe tapering of the right internal carotid artery along most of  its cervical length. NASCET measurement is less than 50% due to the narrowed aspect of the distal ICA. 2. The right internal carotid artery seems to terminate in the ophthalmic artery. There is a  posteriorly extending vessel in the expected location of the posterior communicating artery that travels to an area heterogeneous vascularity, possibly an arteriovenous malformation. MRI of the brain with and without contrast with susceptibility weighted imaging might be helpful. 3. The right middle cerebral artery is supplied via collateral flow from this variant posterior communicating position artery. Electronically Signed   By: Ulyses Jarred M.D.   On: 03/10/2020 20:23   CT Head Wo Contrast  Result Date: 03/09/2020 CLINICAL DATA:  Seizure. Chronic headaches. EXAM: CT HEAD WITHOUT CONTRAST TECHNIQUE: Contiguous axial images were obtained from the base of the skull through the vertex without intravenous contrast. COMPARISON:  07/25/2008 FINDINGS: Brain: No evidence of acute infarction, hemorrhage, hydrocephalus, extra-axial collection, or mass lesion/mass effect. Vascular:  No hyperdense vessel or other acute findings. Skull: No evidence of fracture or other significant bone abnormality. Sinuses/Orbits:  No acute findings. Other: None. IMPRESSION: Negative noncontrast head CT. Electronically Signed   By: Marlaine Hind M.D.   On: 03/09/2020 18:11   CT ANGIO NECK W OR WO CONTRAST  Result Date: 03/10/2020 CLINICAL DATA:  Carotid artery stenosis EXAM: CT ANGIOGRAPHY HEAD AND NECK TECHNIQUE: Multidetector CT imaging of the head and neck was performed using the standard protocol during bolus administration of intravenous contrast. Multiplanar CT image reconstructions and MIPs were obtained to evaluate the vascular anatomy. Carotid stenosis measurements (when applicable) are obtained utilizing NASCET criteria, using the distal internal carotid diameter as the denominator. CONTRAST:  104mL OMNIPAQUE IOHEXOL 350 MG/ML SOLN COMPARISON:  Brain MRI 03/09/2020 carotid duplex 03/10/2020 FINDINGS: CT HEAD FINDINGS Brain: There is no mass, hemorrhage or extra-axial collection. The size and configuration of the ventricles  and extra-axial CSF spaces are normal. There is no acute or chronic infarction. The brain parenchyma is normal. Skull: The visualized skull base, calvarium and extracranial soft tissues are normal. Sinuses/Orbits: No fluid levels or advanced mucosal thickening of the visualized paranasal sinuses. No mastoid or middle ear effusion. The orbits are normal. CTA NECK FINDINGS SKELETON: There is no bony spinal canal stenosis. No lytic or blastic lesion. OTHER NECK: Normal pharynx, larynx and major salivary glands. No cervical lymphadenopathy. Unremarkable thyroid gland. UPPER CHEST: No pneumothorax or pleural effusion. No nodules or masses. AORTIC ARCH: There is no calcific atherosclerosis of the aortic arch. There is no aneurysm, dissection or hemodynamically significant stenosis of the visualized portion of the aorta. Conventional 3 vessel aortic branching pattern. The visualized proximal subclavian arteries are widely patent. RIGHT CAROTID SYSTEM: There is qualitatively severe narrowing of the right internal carotid artery following abrupt tapering at its proximal aspect. However, by NASCET criteria, stenosis measures less than 50% due to the narrowed caliber of the distal ICA. LEFT CAROTID SYSTEM: Normal without aneurysm, dissection or stenosis. VERTEBRAL ARTERIES: Left dominant configuration. Both origins are clearly patent. There is no dissection, occlusion or flow-limiting stenosis to the skull base (V1-V3 segments). CTA HEAD FINDINGS POSTERIOR CIRCULATION: --Vertebral arteries: Normal V4 segments. --Inferior cerebellar arteries: Normal. --Basilar artery: Normal. --Superior cerebellar arteries: Normal. --Posterior cerebral arteries (PCA): Normal. The left PCA is predominantly supplied by the posterior communicating artery. ANTERIOR CIRCULATION: --Intracranial internal carotid arteries: Severe narrowing of the right ICA cavernous segment with possible occlusion at most distal aspect. The right carotid terminus is  opacified via flow from a collateral  pathway. There is a posteriorly traveling vessel from the right carotid terminus the passes through an area vascular irregularity (series 7 image 123). --Anterior cerebral arteries (ACA): Normal. Absent right A1 segment, normal variant --Middle cerebral arteries (MCA): Normal. VENOUS SINUSES: As permitted by contrast timing, patent. ANATOMIC VARIANTS: None Review of the MIP images confirms the above findings. IMPRESSION: 1. Qualitatively severe tapering of the right internal carotid artery along most of its cervical length. NASCET measurement is less than 50% due to the narrowed aspect of the distal ICA. 2. The right internal carotid artery seems to terminate in the ophthalmic artery. There is a posteriorly extending vessel in the expected location of the posterior communicating artery that travels to an area heterogeneous vascularity, possibly an arteriovenous malformation. MRI of the brain with and without contrast with susceptibility weighted imaging might be helpful. 3. The right middle cerebral artery is supplied via collateral flow from this variant posterior communicating position artery. Electronically Signed   By: Ulyses Jarred M.D.   On: 03/10/2020 20:23   MR BRAIN W WO CONTRAST  Result Date: 03/09/2020 CLINICAL DATA:  Encephalopathy. History of neurofibromatosis type 1 with seizures and chronic headaches. EXAM: MRI HEAD WITHOUT AND WITH CONTRAST MRI CERVICAL SPINE WITHOUT AND WITH CONTRAST TECHNIQUE: Multiplanar, multiecho pulse sequences of the brain and surrounding structures, and cervical spine, to include the craniocervical junction and cervicothoracic junction, were obtained without and with intravenous contrast. CONTRAST:  70mL GADAVIST GADOBUTROL 1 MMOL/ML IV SOLN COMPARISON:  Cervical spine MRI 10/30/2018 FINDINGS: MRI HEAD FINDINGS Brain: No acute infarct, acute hemorrhage or extra-axial collection. Normal white matter signal. Normal volume of CSF spaces.  Single focus of magnetic susceptibility effect in the medial left cerebellum at the dentate nucleus. Normal midline structures. Vascular: Abnormal right internal carotid artery flow void. Skull and upper cervical spine: Normal calvarium. Sinuses/Orbits: Paranasal sinuses are clear. Normal orbits. Other: None MRI CERVICAL SPINE FINDINGS Alignment: Reversal of normal cervical lordosis. No static subluxation. Vertebrae: No fracture, evidence of discitis, or bone lesion. Cord: Normal signal and morphology. Posterior Fossa, vertebral arteries, paraspinal tissues: Negative Disc levels: No disc herniation. No spinal canal or neural foraminal stenosis. IMPRESSION: 1. No acute intracranial abnormality. 2. Abnormal right internal carotid artery flow void, consistent with occlusion or flow-limiting stenosis. Narrowing of the right ICA was described in an outside MRI report from 01/15/2010. Correlation with vascular ultrasound or CTA/MRA of the neck with contrast may be helpful. 3. 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 4. Normal cervical spine MRI. Electronically Signed   By: Ulyses Jarred M.D.   On: 03/09/2020 22:48   MR CERVICAL SPINE W WO CONTRAST  Result Date: 03/09/2020 CLINICAL DATA:  Encephalopathy. History of neurofibromatosis type 1 with seizures and chronic headaches. EXAM: MRI HEAD WITHOUT AND WITH CONTRAST MRI CERVICAL SPINE WITHOUT AND WITH CONTRAST TECHNIQUE: Multiplanar, multiecho pulse sequences of the brain and surrounding structures, and cervical spine, to include the craniocervical junction and cervicothoracic junction, were obtained without and with intravenous contrast. CONTRAST:  26mL GADAVIST GADOBUTROL 1 MMOL/ML IV SOLN COMPARISON:  Cervical spine MRI 10/30/2018 FINDINGS: MRI HEAD FINDINGS Brain: No acute infarct, acute hemorrhage or extra-axial collection. Normal white matter signal. Normal volume of CSF spaces.  Single focus of magnetic susceptibility effect in the medial left cerebellum at the dentate nucleus. Normal midline structures. Vascular: Abnormal right internal carotid artery flow void. Skull and upper cervical spine: Normal calvarium. Sinuses/Orbits: Paranasal  sinuses are clear. Normal orbits. Other: None MRI CERVICAL SPINE FINDINGS Alignment: Reversal of normal cervical lordosis. No static subluxation. Vertebrae: No fracture, evidence of discitis, or bone lesion. Cord: Normal signal and morphology. Posterior Fossa, vertebral arteries, paraspinal tissues: Negative Disc levels: No disc herniation. No spinal canal or neural foraminal stenosis. IMPRESSION: 1. No acute intracranial abnormality. 2. Abnormal right internal carotid artery flow void, consistent with occlusion or flow-limiting stenosis. Narrowing of the right ICA was described in an outside MRI report from 01/15/2010. Correlation with vascular ultrasound or CTA/MRA of the neck with contrast may be helpful. 3. 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 4. Normal cervical spine MRI. Electronically Signed   By: Ulyses Jarred M.D.   On: 03/09/2020 22:48   US Carotid Bilateral  Result Date: 03/10/2020 CLINICAL DATA:  39 year old female with a history right ICA stenosis EXAM: BILATERAL CAROTID DUPLEX ULTRASOUND TECHNIQUE: Pearline Cables scale imaging, color Doppler and duplex ultrasound were performed of bilateral carotid and vertebral arteries in the neck. COMPARISON:  None. FINDINGS: Criteria: Quantification of carotid stenosis is based on velocity parameters that correlate the residual internal carotid diameter with NASCET-based stenosis levels, using the diameter of the distal internal carotid lumen as the denominator for stenosis measurement. The following velocity measurements were obtained: RIGHT ICA:  Systolic 63 cm/sec, Diastolic 15 cm/sec CCA:  85 cm/sec SYSTOLIC ICA/CCA  RATIO:  0.7 ECA:  63 cm/sec LEFT ICA:  Systolic 83 cm/sec, Diastolic 41 cm/sec CCA:  50 cm/sec SYSTOLIC ICA/CCA RATIO:  1.7 ECA:   cm/sec Right Brachial SBP: Not acquired Left Brachial SBP: Not acquired RIGHT CAROTID ARTERY: Unremarkable appearance of the carotid system without significant atherosclerotic disease. Intermediate waveform of the CCA. Low resistance waveform of the ICA. No significant tortuosity of the carotid system. RIGHT VERTEBRAL ARTERY: Antegrade flow with low resistance waveform. LEFT CAROTID ARTERY: Unremarkable appearance of the carotid system without significant atherosclerotic disease. Intermediate waveform of the CCA. Low resistance waveform of the ICA. No significant tortuosity of the carotid system. LEFT VERTEBRAL ARTERY:  Antegrade flow with low resistance waveform. IMPRESSION: Color duplex indicates no significant plaque, with no hemodynamically significant stenosis by duplex criteria in the extracranial cerebrovascular circulation. Signed, Dulcy Fanny. Dellia Nims, RPVI [facility] Electronically Signed   By: Corrie Mckusick D.O.   On: 03/10/2020 17:10      Labs: BNP (last 3 results) No results for input(s): BNP in the last 8760 hours. Basic Metabolic Panel: Recent Labs  Lab 03/09/20 1452 03/09/20 2150 03/10/20 0925 03/11/20 0423  NA 140  --   --  140  K 2.9*  --  3.4* 3.4*  CL 108  --   --  111  CO2 23  --   --  22  GLUCOSE 107*  --   --  94  BUN 8  --   --  9  CREATININE 0.65  --   --  0.51  CALCIUM 8.9  --   --  8.7*  MG 1.9  --   --  2.0  PHOS  --  2.5  --   --    Liver Function Tests: Recent Labs  Lab 03/09/20 1452  AST 24  ALT 23  ALKPHOS 63  BILITOT 0.4  PROT 7.3  ALBUMIN 4.1   No results for input(s): LIPASE, AMYLASE in the last 168 hours. No results for input(s): AMMONIA in the last 168  hours. CBC: Recent Labs  Lab 03/09/20 1452 03/11/20 0423  WBC 7.3 7.8  NEUTROABS 4.3  --   HGB  8.8* 8.3*  HCT 31.8* 28.9*  MCV 59.8* 58.1*  PLT 460* 432*   Cardiac Enzymes: Recent Labs  Lab 03/09/20 1452  CKTOTAL 185   BNP: Invalid input(s): POCBNP CBG: No results for input(s): GLUCAP in the last 168 hours. D-Dimer No results for input(s): DDIMER in the last 72 hours. Hgb A1c No results for input(s): HGBA1C in the last 72 hours. Lipid Profile No results for input(s): CHOL, HDL, LDLCALC, TRIG, CHOLHDL, LDLDIRECT in the last 72 hours. Thyroid function studies No results for input(s): TSH, T4TOTAL, T3FREE, THYROIDAB in the last 72 hours.  Invalid input(s): FREET3 Anemia work up Recent Labs    03/10/20 0925  VITAMINB12 238  FOLATE 13.7  TIBC 395  IRON 18*   Urinalysis    Component Value Date/Time   COLORURINE STRAW (A) 03/09/2020 1453   APPEARANCEUR CLEAR (A) 03/09/2020 1453   APPEARANCEUR Cloudy 02/01/2015 1015   LABSPEC 1.006 03/09/2020 1453   LABSPEC 1.005 02/01/2015 1015   PHURINE 6.0 03/09/2020 1453   GLUCOSEU NEGATIVE 03/09/2020 1453   GLUCOSEU Negative 02/01/2015 1015   HGBUR NEGATIVE 03/09/2020 1453   BILIRUBINUR NEGATIVE 03/09/2020 1453   BILIRUBINUR Negative 02/01/2015 1015   KETONESUR 5 (A) 03/09/2020 1453   PROTEINUR NEGATIVE 03/09/2020 1453   NITRITE NEGATIVE 03/09/2020 1453   LEUKOCYTESUR NEGATIVE 03/09/2020 1453   LEUKOCYTESUR Trace 02/01/2015 1015   Sepsis Labs Invalid input(s): PROCALCITONIN,  WBC,  LACTICIDVEN Microbiology Recent Results (from the past 240 hour(s))  SARS Coronavirus 2 by RT PCR (hospital order, performed in [place] hospital lab) Nasopharyngeal Nasopharyngeal Swab     Status: None   Collection Time: 03/09/20  4:10 PM   Specimen: Nasopharyngeal Swab  Result Value Ref Range Status   SARS Coronavirus 2 NEGATIVE NEGATIVE Final    Comment: (NOTE) SARS-CoV-2 target nucleic acids are NOT DETECTED. The SARS-CoV-2 RNA is generally detectable in upper and lower respiratory specimens during the acute phase of infection.  The lowest concentration of SARS-CoV-2 viral copies this assay can detect is 250 copies / mL. A negative result does not preclude SARS-CoV-2 infection and should not be used as the sole basis for treatment or other patient management decisions.  A negative result may occur with improper specimen collection / handling, submission of specimen other than nasopharyngeal swab, presence of viral mutation(s) within the areas targeted by this assay, and inadequate number of viral copies (<250 copies / mL). A negative result must be combined with clinical observations, patient history, and epidemiological information. Fact Sheet for Patients:   StrictlyIdeas.no Fact Sheet for Healthcare Providers: BankingDealers.co.za This test is not yet approved or cleared  by the [place] FDA and has been authorized for detection and/or diagnosis of SARS-CoV-2 by FDA under an Emergency Use Authorization (EUA).  This EUA will remain in effect (meaning this test can be used) for the duration of the COVID-19 declaration under Section 564(b)(1) of the Act, 21 U.S.C. section 360bbb-3(b)(1), unless the authorization is terminated or revoked sooner. Performed at [facility], [address] [place], [place] [address]      Total time spend on discharging this patient, including the last patient exam, discussing the hospital stay, instructions for ongoing care as it relates to all pertinent caregivers, as well as preparing the medical discharge records, prescriptions, and/or referrals as applicable, is 50 minutes.    Enzo Bi,  MD  [facility] 03/11/2020, 4:18 PM  If 7PM-7AM, please contact night-coverage

## 2020-03-11 NOTE — Progress Notes (Signed)
End of Shift Summary:  Date: 5/26-5/27 Shift: 2300-0700 Ambulatory: [facility] with standby assist Significant Events: No complaints overnight.

## 2020-03-12 LAB — LEVETIRACETAM LEVEL: Levetiracetam Lvl: 45.4 ug/mL — ABNORMAL HIGH (ref 10.0–40.0)
